# Patient Record
Sex: Female | Born: 1970 | Race: Black or African American | Hispanic: No | Marital: Single | Smoking: Never smoker
Health system: Southern US, Community
[De-identification: ages and names within clinical notes are randomized; demographics above are authoritative.]

## PROBLEM LIST (undated history)

## (undated) ENCOUNTER — Ambulatory Visit (HOSPITAL_COMMUNITY): Source: Home / Self Care

## (undated) DIAGNOSIS — IMO0002 Reserved for concepts with insufficient information to code with codable children: Secondary | ICD-10-CM

## (undated) DIAGNOSIS — T7840XA Allergy, unspecified, initial encounter: Secondary | ICD-10-CM

## (undated) DIAGNOSIS — R42 Dizziness and giddiness: Secondary | ICD-10-CM

## (undated) DIAGNOSIS — N12 Tubulo-interstitial nephritis, not specified as acute or chronic: Secondary | ICD-10-CM

## (undated) HISTORY — PX: CYSTECTOMY: SUR359

## (undated) HISTORY — DX: Allergy, unspecified, initial encounter: T78.40XA

## (undated) HISTORY — PX: EXTERNAL EAR SURGERY: SHX627

## (undated) HISTORY — DX: Reserved for concepts with insufficient information to code with codable children: IMO0002

---

## 1998-10-23 ENCOUNTER — Emergency Department (HOSPITAL_COMMUNITY): Admission: EM | Admit: 1998-10-23 | Discharge: 1998-10-23 | Payer: Self-pay | Admitting: Emergency Medicine

## 2000-09-03 ENCOUNTER — Other Ambulatory Visit: Admission: RE | Admit: 2000-09-03 | Discharge: 2000-09-03 | Payer: Self-pay | Admitting: *Deleted

## 2000-09-24 ENCOUNTER — Encounter: Admission: RE | Admit: 2000-09-24 | Discharge: 2000-09-24 | Payer: Self-pay | Admitting: *Deleted

## 2000-09-24 ENCOUNTER — Encounter: Payer: Self-pay | Admitting: *Deleted

## 2002-12-30 ENCOUNTER — Emergency Department (HOSPITAL_COMMUNITY): Admission: EM | Admit: 2002-12-30 | Discharge: 2002-12-30 | Payer: Self-pay | Admitting: Emergency Medicine

## 2003-03-30 ENCOUNTER — Emergency Department (HOSPITAL_COMMUNITY): Admission: EM | Admit: 2003-03-30 | Discharge: 2003-03-30 | Payer: Self-pay | Admitting: Emergency Medicine

## 2006-11-27 ENCOUNTER — Emergency Department (HOSPITAL_COMMUNITY): Admission: EM | Admit: 2006-11-27 | Discharge: 2006-11-27 | Payer: Self-pay | Admitting: Emergency Medicine

## 2007-06-14 ENCOUNTER — Emergency Department (HOSPITAL_COMMUNITY): Admission: EM | Admit: 2007-06-14 | Discharge: 2007-06-14 | Payer: Self-pay | Admitting: Emergency Medicine

## 2007-09-19 ENCOUNTER — Emergency Department (HOSPITAL_COMMUNITY): Admission: EM | Admit: 2007-09-19 | Discharge: 2007-09-19 | Payer: Self-pay | Admitting: Emergency Medicine

## 2009-09-16 ENCOUNTER — Emergency Department (HOSPITAL_COMMUNITY): Admission: EM | Admit: 2009-09-16 | Discharge: 2009-09-16 | Payer: Self-pay | Admitting: Emergency Medicine

## 2010-02-02 HISTORY — PX: BUTTOCK MASS EXCISION: SHX1279

## 2010-02-22 ENCOUNTER — Encounter: Payer: Self-pay | Admitting: Family Medicine

## 2010-06-17 NOTE — Consult Note (Signed)
NAME:  Hannah [name], ANDON NO.:  [id number]   MEDICAL RECORD NO.:  [id number]          PATIENT TYPE:  EMS   LOCATION:  ED                           FACILITY:  [facility]   PHYSICIAN:  Hermelinda Medicus, M.D.   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  Her external ear canal looked today to be quite  unremarkable as does her concha and pinna.  She has had multiple  piercings of her ears and now on the right side, she has a 1 cm size  serous collection which is slightly tender.  She complains of no real  pain but it appears that she is developing a right inferior concha  abscess which I am sure in the past was either a congenital or a  sebaceous cyst, where she will be treated first with antibiotics and  then this may need to be drained at a later day.  Her external ear canal  on the right side is clear more medial and her tympanic membrane is also  within normal limits.  Her nose and throat are also clear.  She has a  bad reaction apparently causing a stomach upset with Keflex, but can  take Augmentin without a major problem.   VITAL SIGNS:  Blood pressure is 110/62, heart rate 73.   MEDICATIONS:  She takes no medications.   PLAN:  Our plan today is to treat with Augmentin 875 by mouth as well as  Diflucan 150 mg first day, fifth day, 10th day and she wishes no pain  medication and therefore we will see her again in the office for Dr.  Ezzard Standing or myself on Wednesday.           ______________________________  Hermelinda Medicus, M.D.     JC/MEDQ  D:  06/14/2007  T:  06/14/2007  Job:  [id number]   cc:   Donnetta Hutching, MD  [address] [place], [place] [address]   Kristine Garbe. Ezzard Standing, M.D.  Fax: [phone]

## 2010-06-29 ENCOUNTER — Emergency Department (HOSPITAL_COMMUNITY)
Admission: EM | Admit: 2010-06-29 | Discharge: 2010-06-29 | Disposition: A | Discharge status: Home / Self Care | Payer: Medicaid Other | Attending: Emergency Medicine | Admitting: Emergency Medicine

## 2010-06-29 DIAGNOSIS — H60399 Other infective otitis externa, unspecified ear: Secondary | ICD-10-CM | POA: Insufficient documentation

## 2010-06-29 DIAGNOSIS — L0201 Cutaneous abscess of face: Secondary | ICD-10-CM | POA: Insufficient documentation

## 2010-06-29 DIAGNOSIS — Z9889 Other specified postprocedural states: Secondary | ICD-10-CM | POA: Insufficient documentation

## 2010-06-29 DIAGNOSIS — L03211 Cellulitis of face: Secondary | ICD-10-CM | POA: Insufficient documentation

## 2010-07-07 ENCOUNTER — Other Ambulatory Visit: Payer: Self-pay | Admitting: Family Medicine

## 2010-07-07 DIAGNOSIS — Z1231 Encounter for screening mammogram for malignant neoplasm of breast: Secondary | ICD-10-CM

## 2010-07-21 ENCOUNTER — Ambulatory Visit
Admission: RE | Admit: 2010-07-21 | Discharge: 2010-07-21 | Disposition: A | Discharge status: Home / Self Care | Payer: Medicaid Other | Source: Ambulatory Visit | Attending: Family Medicine | Admitting: Family Medicine

## 2010-07-21 DIAGNOSIS — Z1231 Encounter for screening mammogram for malignant neoplasm of breast: Secondary | ICD-10-CM

## 2010-07-28 ENCOUNTER — Ambulatory Visit (HOSPITAL_BASED_OUTPATIENT_CLINIC_OR_DEPARTMENT_OTHER)
Admission: RE | Admit: 2010-07-28 | Discharge: 2010-07-28 | Disposition: A | Discharge status: Home / Self Care | Payer: Medicaid Other | Source: Ambulatory Visit | Attending: Surgery | Admitting: Surgery

## 2010-07-28 ENCOUNTER — Other Ambulatory Visit (INDEPENDENT_AMBULATORY_CARE_PROVIDER_SITE_OTHER): Payer: Self-pay | Admitting: Surgery

## 2010-07-28 DIAGNOSIS — L723 Sebaceous cyst: Secondary | ICD-10-CM | POA: Insufficient documentation

## 2010-07-28 HISTORY — PX: OTHER SURGICAL HISTORY: SHX169

## 2010-07-31 NOTE — Op Note (Signed)
  [name], [name]                 ACCOUNT NO.:  [id number]  MEDICAL RECORD NO.:  [id number]  LOCATION:                                 FACILITY:  PHYSICIAN:  Sandria Bales. Ezzard Standing, M.D.  DATE OF BIRTH:  01/23/1971  DATE OF PROCEDURE:  07/28/2010                               OPERATIVE REPORT  PREOPERATIVE DIAGNOSIS:  A 1-cm lesion of suprapubic area, left inner thigh, left buttocks.  POSTOPERATIVE DIAGNOSIS:  A 1-cm lesion suprapubic area, 1 cm lesion of left inner thigh, 1 cm lesion left buttocks.  PROCEDURE:  Excision of subcutaneous lesions at the suprapubic area, left inner thigh, left buttocks.  SURGEON:  Sandria Bales. Ezzard Standing, MD  ANESTHESIA:  30 mL of 1% Xylocaine with epinephrine.  COMPLICATIONS:  None.  DETAILS OF THE PROCEDURE:  Hannah [name] is a 40 year old African American female, patient of Dr. Windle Guard, who has 3 skin lesions, 1 in her suprapubic area, 1 in her left inner thigh, and 1 in the left buttocks that she wanted to excise.  She now comes for excision of these areas. The indications and risks of the procedure were explained to the patient.  OPERATIVE NOTE:  The patient was placed first in supine and then left lateral decubitus position, each lesion was painted with Betadine solution.  I infiltrated a local anesthetic using a total of 13 mL between the 3 areas.  In each area, I excised the skin and the lesions.   They looked like small sebaceous cyst.  Each was excised in its entirety.  I closed with 5-0 Vicryl suture, painted each wound with Dermabond.  The patient tolerated the procedure well.  She will keep them dry for 24 hours, see me back in 2-3 weeks for wound check and review of pathology.    Sandria Bales. Ezzard Standing, M.D., FACS   DHN/MEDQ  D:  07/28/2010  T:  07/29/2010  Job:  [id number]  cc:   Windle Guard, M.D.  Electronically Signed by Ovidio Kin M.D. on 07/31/2010 07:17:23 AM

## 2010-08-07 ENCOUNTER — Encounter (INDEPENDENT_AMBULATORY_CARE_PROVIDER_SITE_OTHER): Payer: Self-pay | Admitting: Surgery

## 2010-08-14 ENCOUNTER — Ambulatory Visit (INDEPENDENT_AMBULATORY_CARE_PROVIDER_SITE_OTHER): Payer: Self-pay | Admitting: Surgery

## 2010-08-14 ENCOUNTER — Encounter (INDEPENDENT_AMBULATORY_CARE_PROVIDER_SITE_OTHER): Payer: Self-pay | Admitting: Surgery

## 2010-08-14 VITALS — Temp 97.1°F | Wt 167.4 lb

## 2010-08-14 DIAGNOSIS — L723 Sebaceous cyst: Secondary | ICD-10-CM

## 2010-08-14 NOTE — Progress Notes (Signed)
[  Old chart unavailable.]  The patient is a 40 year old African American female who sees Dr. Windle Guard is her primary care doctor.  She had 3 skin lesions: One in her suprapubic area, one in her left inner thigh, and one in the left buttocks.  I excised these 3 lesions on 28 July 2010. The pathology showed these to be sebaceous cyst.  Physical exam: All 3 incisions are well-healed.  Assessment and plan: #1. Sebaceous cyst of suprapubic area, left inner thigh, and left buttocks - all removed. Path report given to patient. Return to office PRN.

## 2011-02-13 ENCOUNTER — Emergency Department (HOSPITAL_COMMUNITY)
Admission: EM | Admit: 2011-02-13 | Discharge: 2011-02-13 | Disposition: A | Discharge status: Home / Self Care | Payer: Self-pay | Attending: Emergency Medicine | Admitting: Emergency Medicine

## 2011-02-13 ENCOUNTER — Encounter (HOSPITAL_COMMUNITY): Payer: Self-pay

## 2011-02-13 DIAGNOSIS — R42 Dizziness and giddiness: Secondary | ICD-10-CM | POA: Insufficient documentation

## 2011-02-13 HISTORY — DX: Dizziness and giddiness: R42

## 2011-02-13 MED ORDER — MECLIZINE HCL 25 MG PO TABS
25.0000 mg | ORAL_TABLET | Freq: Once | ORAL | Status: AC
  Administered 2011-02-13: 25 mg via ORAL
  Filled 2011-02-13: qty 1

## 2011-02-13 MED ORDER — MECLIZINE HCL 50 MG PO TABS: 25.0000 mg | ORAL_TABLET | Freq: Three times a day (TID) | ORAL | Status: AC | PRN

## 2011-02-13 NOTE — ED Notes (Signed)
Pt. States she woke this AM with dizziness and itching of right ear. Pt. States she was diagnosed with vertigo in 2011 and does not have medication now for vertigo. Pt. Denies nausea.

## 2011-02-13 NOTE — ED Provider Notes (Signed)
History     CSN: [id number]  Arrival date & time 02/13/11  2130   First MD Initiated Contact with Patient 02/13/11 [phone]      Chief Complaint  Patient presents with  . Dizziness    (Consider location/radiation/quality/duration/timing/severity/associated sxs/prior treatment) The history is provided by the patient.  pt c/o dizziness onset this morning when turned towards alarm clock in bed. Gets transient room spinning sensation, lasts seconds, worse w head movement. No nv. Does note recent mild nasal congestion, blowing nose, using q tip in ear. No ear pain. No headache. No fever or chills. Denies hearing loss or tinnitus. No eye pain or change in vision. No numbness/weaknes. No change balance or coordination.  No problems w dexterity in hands. Drove self to ED. States hx same due to 'bpv'. No headache. No recent trauma or fall.   Past Medical History  Diagnosis Date  . Cyst     supra pubic  . Allergy   . Vertigo     Past Surgical History  Procedure Date  . External ear surgery   . Inner thigh 07/28/2010    left upper thigh  . Buttock mass excision 2012  . Cystectomy     History reviewed. No pertinent family history.  History  Substance Use Topics  . Smoking status: Never Smoker   . Smokeless tobacco: Never Used  . Alcohol Use: Yes    OB History    Grav Para Term Preterm Abortions TAB SAB Ect Mult Living                  Review of Systems  Constitutional: Negative for fever and chills.  HENT: Negative for sore throat, neck pain and neck stiffness.   Eyes: Negative for visual disturbance.  Respiratory: Negative for shortness of breath.   Cardiovascular: Negative for chest pain and palpitations.  Gastrointestinal: Negative for vomiting and abdominal pain.  Genitourinary: Negative for flank pain.  Musculoskeletal: Negative for back pain.  Skin: Negative for rash.  Neurological: Negative for weakness, numbness and headaches.  Hematological: Does not bruise/bleed  easily.  Psychiatric/Behavioral: Negative for confusion.    Allergies  Review of patient's allergies indicates no known allergies.  Home Medications   Current Outpatient Rx  Name Route Sig Dispense Refill  . VITAMIN D 2000 UNITS PO CAPS Oral Take 2,000 capsules by mouth 1 day or 1 dose.      Marland Kitchen FLAX SEED OIL PO Oral Take by mouth 1 day or 1 dose.        BP 131/76  Pulse 86  Temp(Src) 98.1 F (36.7 C) (Oral)  Resp 16  SpO2 100%  Physical Exam  Nursing note and vitals reviewed. Constitutional: She is oriented to person, place, and time. She appears well-developed and well-nourished. No distress.  HENT:  Head: Atraumatic.  Right Ear: External ear normal.  Left Ear: External ear normal.  Mouth/Throat: Oropharynx is clear and moist.  Eyes: Conjunctivae are normal. Pupils are equal, round, and reactive to light. No scleral icterus.  Neck: Normal range of motion. Neck supple. No tracheal deviation present.  Cardiovascular: Normal rate, regular rhythm, normal heart sounds and intact distal pulses.  Exam reveals no gallop and no friction rub.   No murmur heard. Pulmonary/Chest: Effort normal and breath sounds normal. No respiratory distress.  Abdominal: Soft. Normal appearance. She exhibits no distension. There is no tenderness.  Musculoskeletal: She exhibits no edema.  Neurological: She is alert and oriented to person, place, and time. No cranial  nerve deficit. Coordination normal.       No facial droop or cr n deficit noted. Motor intact bil. Steady gait. Coordination normal. Transient unidirectional nystagmus.   Skin: Skin is warm and dry. No rash noted.  Psychiatric: She has a normal mood and affect.    ED Course  Procedures (including critical care time)     MDM  Pt notes hx same symptoms. States is out of antivert.   Recent qtips in ear and mild uri symptoms.  No other neurologic symptoms or neuro findings on exam.   antivert po.           Suzi Roots,  MD 02/13/11 [phone]

## 2011-02-13 NOTE — ED Notes (Signed)
Dr. Steinl at bedside 

## 2011-11-22 ENCOUNTER — Emergency Department (HOSPITAL_COMMUNITY): Payer: Self-pay

## 2011-11-22 ENCOUNTER — Emergency Department (HOSPITAL_COMMUNITY)
Admission: EM | Admit: 2011-11-22 | Discharge: 2011-11-23 | Disposition: A | Discharge status: Home / Self Care | Payer: Self-pay | Attending: Emergency Medicine | Admitting: Emergency Medicine

## 2011-11-22 ENCOUNTER — Encounter (HOSPITAL_COMMUNITY): Payer: Self-pay | Admitting: *Deleted

## 2011-11-22 DIAGNOSIS — R079 Chest pain, unspecified: Secondary | ICD-10-CM | POA: Insufficient documentation

## 2011-11-22 DIAGNOSIS — R0789 Other chest pain: Secondary | ICD-10-CM | POA: Insufficient documentation

## 2011-11-22 LAB — URINALYSIS, ROUTINE W REFLEX MICROSCOPIC
Bilirubin Urine: NEGATIVE
Hgb urine dipstick: NEGATIVE
Ketones, ur: NEGATIVE mg/dL
Nitrite: NEGATIVE
pH: 7.5 (ref 5.0–8.0)

## 2011-11-22 LAB — PREGNANCY, URINE: Preg Test, Ur: NEGATIVE

## 2011-11-22 LAB — CBC
MCH: 29.1 pg (ref 26.0–34.0)
MCHC: 33.1 g/dL (ref 30.0–36.0)
Platelets: 261 10*3/uL (ref 150–400)

## 2011-11-22 NOTE — ED Notes (Signed)
Upper chest wall pain on and off all day; states is "tightness"; denies other complaints

## 2011-11-22 NOTE — ED Notes (Addendum)
Pt denies chest pain at present.  Been under great deal of stress recent death of father.  Episodes all day lasting "just seconds"

## 2011-11-23 LAB — BASIC METABOLIC PANEL
Calcium: 9.1 mg/dL (ref 8.4–10.5)
Creatinine, Ser: 0.66 mg/dL (ref 0.50–1.10)
GFR calc non Af Amer: >90 mL/min (ref >90)
Glucose, Bld: 81 mg/dL (ref 70–99)
Sodium: 137 mEq/L (ref 135–145)

## 2011-11-23 LAB — TROPONIN I: Troponin I: <0.3 ng/mL (ref <0.30)

## 2011-11-23 NOTE — ED Provider Notes (Signed)
Medical screening examination/treatment/procedure(s) were performed by non-physician practitioner and as supervising physician I was immediately available for consultation/collaboration.    Lyanne Co, MD 11/23/11 [phone]

## 2011-11-23 NOTE — ED Provider Notes (Signed)
History     CSN: [id number]  Arrival date & time 11/22/11  2254   First MD Initiated Contact with Patient 11/22/11 2352      Chief Complaint  Patient presents with  . Chest Pain    (Consider location/radiation/quality/duration/timing/severity/associated sxs/prior treatment) HPI History provided by pt.   Pt has had 5 episodes of non-exertional, non-radiating, squeezing sensation in left anterior chest since 12pm today.  The pain is mild and seems to be superficial, like in her breast tissue.  No associated fever, cough, SOB, diaphoresis, nausea or abdominal pain.  Denies breast mass and bleeding/discharge from nipple.  Most recent mammogram 1 year ago and nml.  H/o reflux but this pain feels different.  Denies trauma.  No PMH, no FH of early MI and does not smoke cigarettes.    Past Medical History  Diagnosis Date  . Cyst     supra pubic  . Allergy   . Vertigo     Past Surgical History  Procedure Date  . External ear surgery   . Inner thigh 07/28/2010    left upper thigh  . Buttock mass excision 2012  . Cystectomy     No family history on file.  History  Substance Use Topics  . Smoking status: Never Smoker   . Smokeless tobacco: Never Used  . Alcohol Use: Yes    OB History    Grav Para Term Preterm Abortions TAB SAB Ect Mult Living                  Review of Systems  All other systems reviewed and are negative.    Allergies  Keflex  Home Medications   Current Outpatient Rx  Name Route Sig Dispense Refill  . LORATADINE 10 MG PO TABS Oral Take 10 mg by mouth daily.      BP 119/81  Pulse 84  Temp 98.5 F (36.9 C) (Oral)  Resp 20  SpO2 100%  LMP 11/09/2011  Physical Exam  Nursing note and vitals reviewed. Constitutional: She is oriented to person, place, and time. She appears well-developed and well-nourished. No distress.  HENT:  Head: Normocephalic and atraumatic.  Eyes:       Normal appearance  Neck: Normal range of motion.    Cardiovascular: Normal rate, regular rhythm and intact distal pulses.   Pulmonary/Chest: Effort normal and breath sounds normal. No respiratory distress. She exhibits no tenderness.       No pleuritic pain reported.  No breast mass.  Nml nipple.   Abdominal: Soft. Bowel sounds are normal. She exhibits no distension. There is no tenderness. There is no guarding.  Musculoskeletal: Normal range of motion.       No peripheral edema or calf tenderness  Neurological: She is alert and oriented to person, place, and time.  Skin: Skin is warm and dry. No rash noted.  Psychiatric: She has a normal mood and affect. Her behavior is normal.    ED Course  Procedures (including critical care time)   Date: 11/23/2011  Rate: 85  Rhythm: normal sinus rhythm  QRS Axis: normal  Intervals: normal  ST/T Wave abnormalities: normal  Conduction Disutrbances:none  Narrative Interpretation:   Old EKG Reviewed: none available   Labs Reviewed  CBC - Abnormal; Notable for the following:    Hemoglobin 11.3 (*)     HCT 34.1 (*)     All other components within normal limits  URINALYSIS, ROUTINE W REFLEX MICROSCOPIC - Abnormal; Notable for the following:  APPearance HAZY (*)     Leukocytes, UA SMALL (*)     All other components within normal limits  URINE MICROSCOPIC-ADD ON - Abnormal; Notable for the following:    Squamous Epithelial / LPF FEW (*)     All other components within normal limits  BASIC METABOLIC PANEL  TROPONIN I  PREGNANCY, URINE   Dg Chest 2 View  11/23/2011  *RADIOLOGY REPORT*  Clinical Data: Chest pain, pressure.  CHEST - 2 VIEW  Comparison: None.  Findings: Lungs are clear. No pleural effusion or pneumothorax. The cardiomediastinal contours are within normal limits. The visualized bones and soft tissues are without significant appreciable abnormality.  IMPRESSION: No radiographic evidence of acute cardiopulmonary process.   Original Report Authenticated By: Waneta Martins, M.D.       1. Chest pain       MDM  Healthy [phone] F presents w/ atypical CP.  Feels like its located in breast tissue.  Low risk ACS.  Currently pain free and pain is not reproducible on exam.  Breast tissue and nipple normal.  EKG non-ischemic, troponin and CXR neg.  Results discussed w/ pt.  Recommended mammogram as well as PCP f/u for persistent sx.  Return precautions discussed.         Otilio Miu, [place] 11/23/11 [phone]

## 2012-03-19 ENCOUNTER — Other Ambulatory Visit: Payer: Self-pay

## 2012-11-03 ENCOUNTER — Encounter (HOSPITAL_COMMUNITY): Payer: Self-pay | Admitting: *Deleted

## 2012-11-03 ENCOUNTER — Emergency Department (HOSPITAL_COMMUNITY)
Admission: EM | Admit: 2012-11-03 | Discharge: 2012-11-03 | Disposition: A | Discharge status: Home / Self Care | Payer: Worker's Compensation | Attending: Emergency Medicine | Admitting: Emergency Medicine

## 2012-11-03 ENCOUNTER — Emergency Department (HOSPITAL_COMMUNITY): Payer: Worker's Compensation

## 2012-11-03 DIAGNOSIS — Z79899 Other long term (current) drug therapy: Secondary | ICD-10-CM | POA: Insufficient documentation

## 2012-11-03 DIAGNOSIS — S0003XA Contusion of scalp, initial encounter: Secondary | ICD-10-CM | POA: Insufficient documentation

## 2012-11-03 DIAGNOSIS — S0083XA Contusion of other part of head, initial encounter: Secondary | ICD-10-CM

## 2012-11-03 DIAGNOSIS — Y990 Civilian activity done for income or pay: Secondary | ICD-10-CM | POA: Insufficient documentation

## 2012-11-03 DIAGNOSIS — Y9210 Unspecified residential institution as the place of occurrence of the external cause: Secondary | ICD-10-CM | POA: Insufficient documentation

## 2012-11-03 DIAGNOSIS — Z9889 Other specified postprocedural states: Secondary | ICD-10-CM | POA: Insufficient documentation

## 2012-11-03 DIAGNOSIS — Z872 Personal history of diseases of the skin and subcutaneous tissue: Secondary | ICD-10-CM | POA: Insufficient documentation

## 2012-11-03 DIAGNOSIS — W2203XA Walked into furniture, initial encounter: Secondary | ICD-10-CM | POA: Insufficient documentation

## 2012-11-03 DIAGNOSIS — Y93F2 Activity, caregiving, lifting: Secondary | ICD-10-CM | POA: Insufficient documentation

## 2012-11-03 NOTE — ED Notes (Signed)
Pt was working at [employer] this pm; pt was stating that she was assisting a pt out of the wheelchair with a Hoyer lift when the wheelchair took off and hit the hoyer lift and the bar of the lift struck pt in the left eye; pt denies LOC, pt denies visual loss or visual changes

## 2012-11-03 NOTE — ED Provider Notes (Signed)
CSN: [id number]     Arrival date & time 11/03/12  2045 History  This chart was scribed for non-physician practitioner Johnnette Gourd, PA-C, working with Ward Givens, MD by Ronal Fear, ED scribe. This patient was seen in room WTR7/WTR7 and the patient's care was started at 9:17 PM.    No chief complaint on file.  The history is provided by the patient. No language interpreter was used.  HPI Comments: Hannah [name] is a 42 y.o. female who presents to the Emergency Department after working at [employer] at 730 this evening, she was hit by a machine that struck her in the face  while helping a patient. Area around her left eye is tender to touch. She has not tried any alleviating factors. Applied ice immediately. Pt denies eye pain, visual disturbances, blurred vision double vision or pain with eye movement. No LOC. Denies confusion, n/v. Pt is in no acute distress with no other complaints.    Past Medical History  Diagnosis Date  . Cyst     supra pubic  . Allergy   . Vertigo    Past Surgical History  Procedure Laterality Date  . External ear surgery    . Inner thigh  07/28/2010    left upper thigh  . Buttock mass excision  2012  . Cystectomy     No family history on file. History  Substance Use Topics  . Smoking status: Never Smoker   . Smokeless tobacco: Never Used  . Alcohol Use: Yes   OB History   Grav Para Term Preterm Abortions TAB SAB Ect Mult Living                 Review of Systems  Eyes: Negative for pain, redness and visual disturbance.  Gastrointestinal: Negative for nausea and vomiting.  Psychiatric/Behavioral: Negative for confusion.  All other systems reviewed and are negative.    Allergies  Keflex  Home Medications   Current Outpatient Rx  Name  Route  Sig  Dispense  Refill  . Multiple Vitamins-Minerals (MULTIVITAMIN WITH MINERALS) tablet   Oral   Take 1 tablet by mouth daily.          There were no vitals taken for this visit. Physical Exam   Nursing note and vitals reviewed. Constitutional: She is oriented to person, place, and time. She appears well-developed and well-nourished. No distress.  HENT:  Head: Normocephalic and atraumatic.  Mouth/Throat: Oropharynx is clear and moist.  tenderness around left orbit, no step off noted, however limited by pain. Bruising to lower rim of orbit.   Eyes: Conjunctivae and EOM are normal. Pupils are equal, round, and reactive to light.  Neck: Normal range of motion. Neck supple.  Cardiovascular: Normal rate, regular rhythm and normal heart sounds.   Pulmonary/Chest: Effort normal and breath sounds normal. No respiratory distress.  Musculoskeletal: Normal range of motion. She exhibits no edema.  Neurological: She is alert and oriented to person, place, and time. No cranial nerve deficit or sensory deficit.  CN II-XII grossly intact.  Skin: Skin is warm and dry.  Psychiatric: She has a normal mood and affect. Her behavior is normal.    ED Course  Procedures (including critical care time)  DIAGNOSTIC STUDIES: Oxygen Saturation is 99% on RA, normal by my interpretation.    COORDINATION OF CARE: 9:24 PM- Pt advised of plan for treatment including X-ray for fractures and pt agrees.      Labs Review Labs Reviewed - No data  to display Imaging Review Ct Maxillofacial Wo Cm  11/03/2012   *RADIOLOGY REPORT*  Clinical Data: Left facial trauma.  CT MAXILLOFACIAL WITHOUT CONTRAST  Technique:  Multidetector CT imaging of the maxillofacial structures was performed. Multiplanar CT image reconstructions were also generated.  Comparison: None available at time of study interpretation.  Findings: Mandible appears intact, condyles located with sub centimeter sclerotic lesion right mandibular condyle most consistent with bone island.  No facial fracture.  Trace right maxillary mucosal thickening without air paranasal sinus air-fluid levels.  Mastoid air cells are well aerated.  Nasal septum is midline.   Ocular globes intact.  Mild left periorbital soft tissue swelling without post septal involvement. Partially imaged cervical spine demonstrates at least moderate C5-6 degenerative disc disease.  IMPRESSION: Mild left periorbital soft tissue swelling without post septal hematoma and no facial fracture.   Original Report Authenticated By: Awilda Metro    MDM   1. Facial contusion, initial encounter    Patient with facial contusion near L eye. No eye involvement. EOMI, PERRLA, no pain with eye movements. Tender to palpation around her orbit, exam limited by pain. CT maxillofacial without ay acute abnormalities. Neuro exam unremarkable. Well appearing, NAD. Stable for discharge. Close return precautions given. Ice/NSAIDs for symptoms. Patient states understanding of treatment care plan and is agreeable.   I personally performed the services described in this documentation, which was scribed in my presence. The recorded information has been reviewed and is accurate.    Trevor Mace, PA-C 11/03/12 2256

## 2012-11-03 NOTE — ED Provider Notes (Signed)
Medical screening examination/treatment/procedure(s) were performed by non-physician practitioner and as supervising physician I was immediately available for consultation/collaboration. Alanea Woolridge, MD, FACEP   Yarissa Reining L Lanore Renderos, MD 11/03/12 2340 

## 2012-12-08 ENCOUNTER — Other Ambulatory Visit: Payer: Self-pay

## 2013-01-23 ENCOUNTER — Emergency Department (HOSPITAL_COMMUNITY)
Admission: EM | Admit: 2013-01-23 | Discharge: 2013-01-23 | Disposition: A | Discharge status: Home / Self Care | Payer: PRIVATE HEALTH INSURANCE | Attending: Emergency Medicine | Admitting: Emergency Medicine

## 2013-01-23 DIAGNOSIS — L309 Dermatitis, unspecified: Secondary | ICD-10-CM

## 2013-01-23 DIAGNOSIS — L259 Unspecified contact dermatitis, unspecified cause: Secondary | ICD-10-CM | POA: Insufficient documentation

## 2013-01-23 MED ORDER — PREDNISONE 10 MG PO TABS: 20.0000 mg | ORAL_TABLET | Freq: Every day | ORAL | Status: DC

## 2013-01-23 NOTE — ED Provider Notes (Signed)
CSN: [id number]     Arrival date & time 01/23/13  2126 History  This chart was scribed for non-physician practitioner, Marlon Pel, PA-C,working with Raeford Razor, MD, by Karle Plumber, ED Scribe.  This patient was seen in room WTR9/WTR9 and the patient's care was started at 10:32 PM.  Chief Complaint  Patient presents with  . Rash   The history is provided by the patient. No language interpreter was used.   HPI Comments:  Hannah [name] is a 42 y.o. female who presents to the Emergency Department complaining of a rash on her lower extremities. Pt states the rash started on her upper right thigh about 5 days ago and has been itchy in that area. She states now the rash has has spread down to her lower leg, but that area is not itching. She denies any new creams, soaps, or lotions. She denies any rash on her abdomen, upper extremities, feet, torso, back or anywhere else. She denies fever, chills, appetite change or pain. She states her PCP is Dr. Jeannetta Nap and has follow up appt with him on Friday 01/27/13.   Past Medical History  Diagnosis Date  . Cyst     supra pubic  . Allergy   . Vertigo    Past Surgical History  Procedure Laterality Date  . External ear surgery    . Inner thigh  07/28/2010    left upper thigh  . Buttock mass excision  2012  . Cystectomy     No family history on file. History  Substance Use Topics  . Smoking status: Never Smoker   . Smokeless tobacco: Never Used  . Alcohol Use: Yes   OB History   Grav Para Term Preterm Abortions TAB SAB Ect Mult Living                 Review of Systems  Constitutional: Negative for fever, chills and appetite change.  Skin: Positive for rash.  All other systems reviewed and are negative.    Allergies  Keflex  Home Medications   Current Outpatient Rx  Name  Route  Sig  Dispense  Refill  . Multiple Vitamins-Minerals (MULTIVITAMIN WITH MINERALS) tablet   Oral   Take 1 tablet by mouth daily.         .  predniSONE (DELTASONE) 10 MG tablet   Oral   Take 2 tablets (20 mg total) by mouth daily.   21 tablet   0     Prednisone dose pack directions:   6 tabs on day ...    Triage Vitals: BP 117/70  Pulse 81  Temp(Src) 98.6 F (37 C) (Oral)  Resp 16  SpO2 100% Physical Exam  Nursing note and vitals reviewed. Constitutional: She is oriented to person, place, and time. She appears well-developed and well-nourished.  HENT:  Head: Normocephalic and atraumatic.  Eyes: EOM are normal.  Neck: Normal range of motion.  Cardiovascular: Normal rate.   Pulmonary/Chest: Effort normal.  Musculoskeletal: Normal range of motion.  Neurological: She is alert and oriented to person, place, and time.  Skin: Skin is warm and dry. Rash noted.  Contact dermatitis to bilateral lower extremities. Rash spares the ankles and feet, and do not extend to the abdomen, torso, arms, neck or head.  The rash is itchy and with mild excoriation.  Psychiatric: She has a normal mood and affect. Her behavior is normal.    ED Course  Procedures (including critical care time) DIAGNOSTIC STUDIES: Oxygen Saturation is 100% on  RA, normal by my interpretation.   COORDINATION OF CARE: 10:38 PM- Will treat for an allergic reaction with a Prednisone dose-pak. Advised pt to get OTC Benadryl. Pt verbalizes understanding and agrees to plan.  Medications - No data to display  Labs Review Labs Reviewed - No data to display Imaging Review No results found.  EKG Interpretation   None       MDM   1. Dermatitis    42 y.o.Donnika A Gilroy's evaluation in the Emergency Department is complete. It has been determined that no acute conditions requiring further emergency intervention are present at this time. The patient/guardian have been advised of the diagnosis and plan. We have discussed signs and symptoms that warrant return to the ED, such as changes or worsening in symptoms.  Vital signs are stable at discharge. Filed  Vitals:   01/23/13 2146  BP: 117/70  Pulse: 81  Temp: 98.6 F (37 C)  Resp: 16    Patient/guardian has voiced understanding and agreed to follow-up with the PCP or specialist.  I personally performed the services described in this documentation, which was scribed in my presence. The recorded information has been reviewed and is accurate.    Dorthula Matas, PA-C 01/23/13 2249

## 2013-01-23 NOTE — ED Notes (Signed)
Pt c/o fine red petechial rash to bilateral legs. Pt states rash started last week on her upper thigh and has since spread. Pt states rash is not itchy. Pt with no acute distress.

## 2013-01-31 NOTE — ED Provider Notes (Signed)
Medical screening examination/treatment/procedure(s) were performed by non-physician practitioner and as supervising physician I was immediately available for consultation/collaboration.  EKG Interpretation   None        Raeford Razor, MD 01/31/13 2159

## 2013-02-23 ENCOUNTER — Encounter (INDEPENDENT_AMBULATORY_CARE_PROVIDER_SITE_OTHER): Payer: Self-pay | Admitting: Surgery

## 2013-04-07 ENCOUNTER — Encounter (INDEPENDENT_AMBULATORY_CARE_PROVIDER_SITE_OTHER): Payer: Self-pay | Admitting: Surgery

## 2013-04-27 ENCOUNTER — Encounter (INDEPENDENT_AMBULATORY_CARE_PROVIDER_SITE_OTHER): Payer: Self-pay | Admitting: Surgery

## 2013-04-27 ENCOUNTER — Telehealth (INDEPENDENT_AMBULATORY_CARE_PROVIDER_SITE_OTHER): Payer: Self-pay | Admitting: Surgery

## 2013-04-27 ENCOUNTER — Ambulatory Visit (INDEPENDENT_AMBULATORY_CARE_PROVIDER_SITE_OTHER): Payer: PRIVATE HEALTH INSURANCE | Admitting: Surgery

## 2013-04-27 VITALS — BP 108/80 | HR 82 | Resp 16 | Ht 66.0 in | Wt 164.6 lb

## 2013-04-27 DIAGNOSIS — L723 Sebaceous cyst: Secondary | ICD-10-CM | POA: Insufficient documentation

## 2013-04-27 NOTE — Telephone Encounter (Signed)
Patient met with surgery scheduling went over financial responsibilities, patient will call back to schedule

## 2013-04-27 NOTE — Progress Notes (Signed)
Re:   Hannah BurnMary A [name] DOB:   October 10, 1970 MRN:   [id number]  ASSESSMENT AND PLAN: 1.  3 skin lesions  1.0 cm lesion between the breast  0.5 cm lesion right anterior labia  0.5 cm lesion left posterior labia/vagina  Discussed about excising these under local anesthesia.  It would take about one hour to remove them all.  The main risks would be bleeding, infection, recurrence of the cysts, and nerve injury.  Chief Complaint  Patient presents with  . chest wall cyst   REFERRING PHYSICIAN: Kaleen MaskELKINS,WILSON OLIVER, MD  HISTORY OF PRESENT ILLNESS: Hannah BurnMary A Eleazer is a 43 y.o. (DOB: October 10, 1970)  AA  female whose primary care physician is Kaleen MaskELKINS,WILSON OLIVER, MD and comes to me today for several skin lesions that are bothering her.  She has noticed these for some time.  The one between her breast has gotten larger and the two lesions around her vagina bother her.  We reviewed each of these.   Past Medical History  Diagnosis Date  . Cyst     supra pubic  . Allergy   . Vertigo       Past Surgical History  Procedure Laterality Date  . External ear surgery    . Inner thigh  07/28/2010    left upper thigh  . Buttock mass excision  2012  . Cystectomy        Current Outpatient Prescriptions  Medication Sig Dispense Refill  . Multiple Vitamins-Minerals (MULTIVITAMIN WITH MINERALS) tablet Take 1 tablet by mouth daily.      . predniSONE (DELTASONE) 10 MG tablet Take 2 tablets (20 mg total) by mouth daily.  21 tablet  0   No current facility-administered medications for this visit.      Allergies  Allergen Reactions  . Keflex [Cephalexin] Itching    REVIEW OF SYSTEMS: Skin:  Concerned about several areas on skin: back, right anterior thigh, and between breast. Infection:  No history of hepatitis or HIV.  No history of MRSA. Neurologic:  No history of stroke.  No history of seizure.  No history of headaches. Cardiac:  No history of hypertension. No history of heart disease.   . Pulmonary:  Does not smoke cigarettes.    Endocrine:  No diabetes. No thyroid disease. Gastrointestinal:  No history of stomach disease.  No history of liver disease.  No history of gall bladder disease.  No history of pancreas disease.  No history of colon disease. Urologic:  No history of kidney stones.  No history of bladder infections. Musculoskeletal:  No history of joint or back disease. Hematologic:  No bleeding disorder.  No history of anemia.  Not anticoagulated. Psycho-social:  The patient is oriented.     SOCIAL and FAMILY HISTORY: Works as LawyerCNA at [employer].  She said that she has been there about 6 months.  PHYSICAL EXAM: BP 108/80  Pulse 82  Resp 16  Ht 5\' 6"  (1.676 m)  Wt 164 lb 9.6 oz (74.662 kg)  BMI 26.58 kg/m2  General: WN AA F who is alert and generally healthy appearing.  HEENT: Normal. Pupils equal. Neck: Supple. No mass.   Breasts:  Right and left breast unremarkable.  Between her breast she has a 1.0 cm sebaceous cyst.  She has some acne also that is not amendable to removal. Back:  Acne of upper back, which she says is getting better Vagina/perineum:  0.5 cm cyst right anterior labia and 0.5 cm cyst left posterior labia/perineum.  DATA REVIEWED: None new  Ovidio Kin, MD,  [facility], [place] [address] [place]  [address]   [place], [place]    [address] Phone:  [phone] FAX:  [phone]

## 2013-08-28 ENCOUNTER — Encounter (HOSPITAL_COMMUNITY): Payer: Self-pay | Admitting: Emergency Medicine

## 2013-08-28 ENCOUNTER — Emergency Department (INDEPENDENT_AMBULATORY_CARE_PROVIDER_SITE_OTHER)
Admission: EM | Admit: 2013-08-28 | Discharge: 2013-08-28 | Disposition: A | Discharge status: Home / Self Care | Payer: PRIVATE HEALTH INSURANCE | Source: Home / Self Care | Attending: Emergency Medicine | Admitting: Emergency Medicine

## 2013-08-28 DIAGNOSIS — W57XXXA Bitten or stung by nonvenomous insect and other nonvenomous arthropods, initial encounter: Principal | ICD-10-CM

## 2013-08-28 DIAGNOSIS — S70262A Insect bite (nonvenomous), left hip, initial encounter: Secondary | ICD-10-CM

## 2013-08-28 DIAGNOSIS — S90569A Insect bite (nonvenomous), unspecified ankle, initial encounter: Secondary | ICD-10-CM

## 2013-08-28 NOTE — Discharge Instructions (Signed)
Please use topical benadryl cream or oral benadryl as advised on packing for itching. Continue local wound care at home. You to do have indication of secondary infection.  Fire [employer]nt Bite A fire ant bite appears as a red lump in the skin. It sometimes has a tiny hole in the center. Reactions to these bites can be severe. A severe reaction is called an anaphylactic reaction. With a severe reaction there may be symptoms of wheezing or difficulty breathing, chest pain, fainting and raised red patches on the skin (hives) that itch. There may also be nausea (feeling sick to your stomach), vomiting, cramping or diarrhea. Usually after 24 hours a small sterile pustule (a tiny sac in the skin filled with pus but no germs) develops. There may be itching, burning, and redness. HOME CARE INSTRUCTIONS   Apply a cold compress for 10 to 20 minutes every hour for 1 to 2 days. This will reduce swelling and itching.  After 24 to 48 hours, a warm compress may be soothing and will help decrease swelling.  To relieve itching and swelling, you may use:  Diphenhydramine, available over-the-counter. Take medicine as directed. Do not drink alcohol or drive while taking this medicine.  Hydrocortisone cream may be applied lightly 4 times per day for a couple days or as directed.  Calamine lotion with diphenhydramine may be used lightly on the bite 4 times per day for itching or as directed. Do not take with oral diphenhydramine.  Only take over-the-counter or prescription medicines for pain, discomfort, or fever as directed by your caregiver. SEEK MEDICAL CARE IF:   None of the above helps within 2 to 3 days.  The area becomes red, warm, tender, and swollen beyond the area of the bite or sting. SEEK IMMEDIATE MEDICAL CARE IF:   You have a fever.  You have symptoms of an allergic reaction (wheezing or difficulty breathing).  You develop chest pain, fainting, or raised red patches on the skin that itch.  You  develop nausea, vomiting, cramping, or diarrhea. These may be early signs of a serious generalized or anaphylactic reaction. MAKE SURE YOU:   Understand these instructions.  Will watch your condition.  Will get help right away if you are not doing well or get worse. Document Released: 10/14/2000 Document Revised: 04/13/2011 Document Reviewed: 01/11/2008 [facility] Patient Information 2015 [employer] [place]. This information is not intended to replace advice given to you by your health care provider. Make sure you discuss any questions you have with your health care provider.

## 2013-08-28 NOTE — ED Provider Notes (Signed)
CSN: [id number]     Arrival date & time 08/28/13  1043 History   First MD Initiated Contact with Patient 08/28/13 1110     Chief Complaint  Patient presents with  . Insect Bite   (Consider location/radiation/quality/duration/timing/severity/associated sxs/prior Treatment) HPI Comments: States she was stung by an unknown insect at her left posterior thigh 2 days ago and area is now very itchy. Denies pain, fever or chills. Has tried topical hydrocortisone cream and topical neosporin. Both of these medications have brought her some relief. States she come to the clinic today just to make sure that are does not appear to be infected.  PCP: Dr. Hart CarwinW. Elkins  The history is provided by the patient.    Past Medical History  Diagnosis Date  . Cyst     supra pubic  . Allergy   . Vertigo    Past Surgical History  Procedure Laterality Date  . External ear surgery    . Inner thigh  07/28/2010    left upper thigh  . Buttock mass excision  2012  . Cystectomy     History reviewed. No pertinent family history. History  Substance Use Topics  . Smoking status: Never Smoker   . Smokeless tobacco: Never Used  . Alcohol Use: Yes   OB History   Grav Para Term Preterm Abortions TAB SAB Ect Mult Living                 Review of Systems  All other systems reviewed and are negative.   Allergies  Keflex  Home Medications   Prior to Admission medications   Medication Sig Start Date End Date Taking? Authorizing Provider  Multiple Vitamins-Minerals (MULTIVITAMIN WITH MINERALS) tablet Take 1 tablet by mouth daily.    Historical Provider, MD  predniSONE (DELTASONE) 10 MG tablet Take 2 tablets (20 mg total) by mouth daily. 01/23/13   Tiffany Irine SealG Greene, PA-C   BP 104/59  Pulse 88  Temp(Src) 98.9 F (37.2 C) (Oral)  Resp 16  SpO2 98% Physical Exam  Nursing note and vitals reviewed. Constitutional: She is oriented to person, place, and time. She appears well-developed and well-nourished.    HENT:  Head: Normocephalic and atraumatic.  Cardiovascular: Normal rate.   Pulmonary/Chest: Effort normal.  Musculoskeletal: Normal range of motion.  Neurological: She is alert and oriented to person, place, and time.  Skin: Skin is warm and dry.     Psychiatric: She has a normal mood and affect. Her behavior is normal.    ED Course  Procedures (including critical care time) Labs Review Labs Reviewed - No data to display  Imaging Review No results found.   MDM   1. Insect bite of hip with local reaction, left, initial encounter   No clinical indication of secondary infection. While area does have 3 small fluid filled vesicles, does not appear to be herpes zoster as it completely without pain. Localized reaction to likely insect bite. Advised to use either topical or oral benadryl at home with continued local wound care.     Jess BartersJennifer Lee Hem[place] BridgePresson, [place] 08/28/13 1258

## 2013-08-28 NOTE — ED Notes (Signed)
States she felt bite type sensation on leg 2 days ago, now feels swollen, itchy

## 2013-08-28 NOTE — ED Provider Notes (Signed)
Medical screening examination/treatment/procedure(s) were performed by resident physician or non-physician practitioner and as supervising physician I was immediately available for consultation/collaboration.  Randal BubaErin Waylan Busta, MD     Charm RingsErin J Bevely Hackbart, MD 08/28/13 1420

## 2014-08-13 ENCOUNTER — Other Ambulatory Visit: Payer: Self-pay | Admitting: Obstetrics and Gynecology

## 2014-08-13 DIAGNOSIS — R928 Other abnormal and inconclusive findings on diagnostic imaging of breast: Secondary | ICD-10-CM

## 2014-08-20 ENCOUNTER — Other Ambulatory Visit: Payer: Self-pay

## 2014-12-10 ENCOUNTER — Ambulatory Visit
Admission: RE | Admit: 2014-12-10 | Discharge: 2014-12-10 | Disposition: A | Discharge status: Home / Self Care | Payer: PRIVATE HEALTH INSURANCE | Source: Ambulatory Visit | Attending: Obstetrics and Gynecology | Admitting: Obstetrics and Gynecology

## 2014-12-10 DIAGNOSIS — R928 Other abnormal and inconclusive findings on diagnostic imaging of breast: Secondary | ICD-10-CM

## 2016-07-08 ENCOUNTER — Ambulatory Visit (HOSPITAL_COMMUNITY)
Admission: EM | Admit: 2016-07-08 | Discharge: 2016-07-08 | Disposition: A | Discharge status: Home / Self Care | Payer: PRIVATE HEALTH INSURANCE | Attending: Internal Medicine | Admitting: Internal Medicine

## 2016-07-08 ENCOUNTER — Encounter (HOSPITAL_COMMUNITY): Payer: Self-pay | Admitting: Emergency Medicine

## 2016-07-08 DIAGNOSIS — R300 Dysuria: Secondary | ICD-10-CM | POA: Insufficient documentation

## 2016-07-08 DIAGNOSIS — N390 Urinary tract infection, site not specified: Secondary | ICD-10-CM | POA: Diagnosis present

## 2016-07-08 HISTORY — DX: Tubulo-interstitial nephritis, not specified as acute or chronic: N12

## 2016-07-08 LAB — POCT URINALYSIS DIP (DEVICE)
BILIRUBIN URINE: NEGATIVE
Glucose, UA: NEGATIVE mg/dL
Ketones, ur: NEGATIVE mg/dL
LEUKOCYTES UA: NEGATIVE
Nitrite: NEGATIVE
PH: 7 (ref 5.0–8.0)
Protein, ur: NEGATIVE mg/dL
SPECIFIC GRAVITY, URINE: 1.015 (ref 1.005–1.030)
UROBILINOGEN UA: 0.2 mg/dL (ref 0.0–1.0)

## 2016-07-08 NOTE — Discharge Instructions (Signed)
Call if you are getting worse, have fever, back pain, or worsening dysuria.

## 2016-07-08 NOTE — ED Triage Notes (Signed)
2 days ago noticed pulsating feeling in back.  Pain at the end of urinary stream.

## 2016-07-08 NOTE — ED Provider Notes (Signed)
CSN: [id number]     Arrival date & time 07/08/16  1536 History   None    Chief Complaint  Patient presents with  . Urinary Tract Infection   (Consider location/radiation/quality/duration/timing/severity/associated sxs/prior Treatment)  Complains of pulsing in the kidney that started two days ago and has since resolved.  Had one episode of dysuria last night near the end of urination and tells me that the dysuria has since improved.  Has tried cranberry juice and copious fluids with good improvement. 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 She is not urinating more frequently and denies any outright urinary urgency. She is near the end of her menses at day 4. Denies any antipyretic at this time. No changes in bowel habits at this time. Has a history of pyelonephritis over 20 years ago but no UTI since.  Denies abnormal vaginal discharge.        Past Medical History:  Diagnosis Date  . Allergy   . Cyst    supra pubic  . Pyelonephritis   . Vertigo    Past Surgical History:  Procedure Laterality Date  . BUTTOCK MASS EXCISION  2012  . CYSTECTOMY    . EXTERNAL EAR SURGERY    . inner thigh  07/28/2010   left upper thigh   History reviewed. No pertinent family history. Social History  Substance Use Topics  . Smoking status: Never Smoker  . Smokeless tobacco: Never Used  . Alcohol use Yes   OB History    No data available     Review of Systems  Genitourinary: Negative for dysuria, flank pain, menstrual problem, pelvic pain and urgency.    Allergies  Keflex [cephalexin]  Home Medications   Prior to Admission medications   Medication Sig Start Date End Date Taking? Authorizing Provider  cetirizine (ZYRTEC) 10 MG tablet Take 10 mg by mouth daily.   Yes [provider]  omega-3 acid ethyl esters (LOVAZA) 1 g capsule Take by mouth 2 (two) times daily.   Yes [provider]  Multiple Vitamins-Minerals (MULTIVITAMIN WITH MINERALS) tablet Take 1 tablet by mouth daily.     [provider]  predniSONE (DELTASONE) 10 MG tablet Take 2 tablets (20 mg total) by mouth daily. 01/23/13   Marlon PelGreene, Tiffany, PA-C   Meds Ordered and Administered this Visit  Medications - No data to display  BP 135/83 (BP Location: Right Arm)   Pulse 83   Temp 98.6 F (37 C) (Oral)   Resp 18   SpO2 99%  No data found.   Physical Exam  Constitutional: She appears well-developed and well-nourished. No distress.  Cardiovascular: Normal rate.   Pulmonary/Chest: Effort normal and breath sounds normal. No respiratory distress.  Abdominal: Soft. Bowel sounds are normal.  Musculoskeletal: She exhibits no edema, tenderness or deformity.  Skin: She is not diaphoretic.    Urgent Care Course     Procedures (including critical care time)  Labs Review Labs Reviewed  POCT URINALYSIS DIP (DEVICE) - Abnormal; Notable for the following:       Result Value   Hgb urine dipstick MODERATE (*)    All other components within normal limits  URINE CULTURE         MDM   1. Dysuria    She is on her cycle. Largely asymptomatic today and she has a history of what sound like a severe pyelonephritis in her 4920s, thus provoking some hypervigilence with regard to any urinary symptoms.   If she begins to have fevers,  chills, flank pain, worsening dysuria will order Cipro 500 mg BID and advise that she return to clinic ASAP. Otherwise I think she is well and do not recommend any treatments at this time.     Ofilia Neas, PA-C 07/08/16 1648    Ofilia Neas, PA-C 07/08/16 1726

## 2016-07-10 LAB — URINE CULTURE: Culture: >=100000 — AB

## 2016-08-25 ENCOUNTER — Encounter (HOSPITAL_COMMUNITY): Payer: Self-pay | Admitting: Emergency Medicine

## 2016-08-25 ENCOUNTER — Emergency Department (HOSPITAL_COMMUNITY)
Admission: EM | Admit: 2016-08-25 | Discharge: 2016-08-25 | Disposition: A | Discharge status: Home / Self Care | Payer: PRIVATE HEALTH INSURANCE | Attending: Emergency Medicine | Admitting: Emergency Medicine

## 2016-08-25 ENCOUNTER — Emergency Department (HOSPITAL_COMMUNITY): Payer: PRIVATE HEALTH INSURANCE

## 2016-08-25 ENCOUNTER — Emergency Department (HOSPITAL_COMMUNITY)
Admission: EM | Admit: 2016-08-25 | Discharge: 2016-08-25 | Discharge status: Absent without leave | Payer: PRIVATE HEALTH INSURANCE | Source: Home / Self Care

## 2016-08-25 DIAGNOSIS — Y92128 Other place in nursing home as the place of occurrence of the external cause: Secondary | ICD-10-CM | POA: Insufficient documentation

## 2016-08-25 DIAGNOSIS — S6991XA Unspecified injury of right wrist, hand and finger(s), initial encounter: Secondary | ICD-10-CM | POA: Diagnosis present

## 2016-08-25 DIAGNOSIS — Y990 Civilian activity done for income or pay: Secondary | ICD-10-CM | POA: Insufficient documentation

## 2016-08-25 DIAGNOSIS — Z79899 Other long term (current) drug therapy: Secondary | ICD-10-CM | POA: Insufficient documentation

## 2016-08-25 DIAGNOSIS — Y9389 Activity, other specified: Secondary | ICD-10-CM | POA: Diagnosis not present

## 2016-08-25 DIAGNOSIS — S62524A Nondisplaced fracture of distal phalanx of right thumb, initial encounter for closed fracture: Secondary | ICD-10-CM

## 2016-08-25 DIAGNOSIS — W230XXA Caught, crushed, jammed, or pinched between moving objects, initial encounter: Secondary | ICD-10-CM | POA: Insufficient documentation

## 2016-08-25 NOTE — Discharge Instructions (Signed)
Your xrays show probable thumb fracture. As we discussed you can take Alieve at home for pain.   How is this treated? A thumb fracture should be treated as soon as possible. You may need to wear a padded splint to keep your thumb from moving and to protect your thumb  Immobilization. A cast or splint is put on the injured area without changing the position of the broken bone. You may have to wear a type of cast called a spica cast or hitchhiker cast to hold the thumb in the proper position. A cast is usually left on for 4?6 weeks. You willl need to follow up with hand (ortho) in 1 week to decide if further treatment is needed. This can include (but is unlikely you will need this)  Closed reduction. In this procedure, the bones are put back into position without surgery. Open reduction and internal fixation (ORIF). This is a surgical procedure. First, the fracture site is opened up. Then, the bone pieces are fixed into place with metal screws, plates, or wires. External fixation. In this type of open reduction, the fracture is held in place by metal pins. The pins are attached to a stabilizing bar outside your skin. You may need to wear a cast after surgery for up to 6 weeks. You may need to return for X-rays to make sure your thumb is healing properly. After your cast is taken off, you may need to do hand exercises (physical therapy) to get movement back in your thumb. It may take another 3 months to regain complete use of your thumb.  Follow these instructions at home: Take medicines only as directed by your health care provider. Keep your hand elevated above the level of your heart when resting. Keep your cast dry when bathing. Cover it with a plastic bag as directed by your health care provider. After your cast is removed, exercise your thumb at home. Your health care provider may suggest that you: Move your thumb in circles. Touch your thumb to your little finger. Do these exercises  several times a day. Ask your health care provider whether you can use a hand exerciser to strengthen your muscles. If your thumb feels stiff while you are exercising it, try doing the exercises while soaking your hand in warm water. Keep all follow-up visits as directed by your health care provider. This is important. Contact a health care provider if: You have more than a small spot of bleeding from under your cast or splint. Your pain medicine is not helping. You have a fever. You have numbness or tingling in the injured area. Your cast becomes loose or damaged. You notice a bad odor or discharge coming from under your cast. Get help right away if: You have pain that is very bad or getting worse. You lose feeling in your thumb. Your thumb turns pale or blue. Your thumb feels cold. You have drainage, redness, or swelling at the injury site.  If you develop worsening or new concerning symptoms you can return to the emergency department for re-evaluation.

## 2016-08-25 NOTE — ED Notes (Signed)
Patient was offered an ice pack but she does not want one.

## 2016-08-25 NOTE — ED Provider Notes (Signed)
[facility]-EMERGENCY DEPT Provider Note   CSN: [id number] Arrival date & time: 08/25/16  1404     History   Chief Complaint Chief Complaint  Patient presents with  . Finger Injury    HPI Hannah [name] is a 46 y.o. female female who presents to the emergency department today after smashing her right thumb in a heavy door around 8:30 AM this morning. 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 During her shift she was trying to close a door to the patient's room on the door slammed into the distal aspect of her right thumb. She immediately noted pain over the distal portion of the thumb that is now throbbing in nature and has been constant since then. She denies any numbness or tingling associated with this. No decreased range of motion or weakness. She has not taken anything for this.  HPI  Past Medical History:  Diagnosis Date  . Allergy   . Cyst    supra pubic  . Pyelonephritis   . Vertigo     Patient Active Problem List   Diagnosis Date Noted  . Sebaceous cyst 04/27/2013    Past Surgical History:  Procedure Laterality Date  . BUTTOCK MASS EXCISION  2012  . CYSTECTOMY    . EXTERNAL EAR SURGERY    . inner thigh  07/28/2010   left upper thigh    OB History    No data available       Home Medications    Prior to Admission medications   Medication Sig Start Date End Date Taking? Authorizing Provider  cetirizine (ZYRTEC) 10 MG tablet Take 10 mg by mouth daily.    [provider]  Multiple Vitamins-Minerals (MULTIVITAMIN WITH MINERALS) tablet Take 1 tablet by mouth daily.    [provider]  omega-3 acid ethyl esters (LOVAZA) 1 g capsule Take by mouth 2 (two) times daily.    [provider]    Family History No family history on file.  Social History Social History  Substance Use Topics  . Smoking status: Never Smoker  . Smokeless tobacco: Never Used  . Alcohol use Yes     Allergies   Keflex [cephalexin]   Review of  Systems Review of Systems  Musculoskeletal: Positive for arthralgias. Negative for myalgias.  Skin: Negative for wound.  Neurological: Negative for weakness and numbness.     Physical Exam Updated Vital Signs BP 133/79 (BP Location: Right Arm)   Pulse 70   Temp 98.7 F (37.1 C) (Oral)   Resp 20   Ht 5\' 5"  (1.651 m)   Wt 84.3 kg (185 lb 12.8 oz)   LMP 08/25/2016   SpO2 100%   BMI 30.92 kg/m   Physical Exam  Constitutional: She appears well-developed and well-nourished.  HENT:  Head: Normocephalic and atraumatic.  Right Ear: External ear normal.  Left Ear: External ear normal.  Eyes: Conjunctivae are normal. Right eye exhibits no discharge. Left eye exhibits no discharge. No scleral icterus.  Pulmonary/Chest: Effort normal. No respiratory distress.  Musculoskeletal:  Right hand: No gross abnormalities, skin intact. Fingers appear normal. No tenderness to palpation over the flexor sheath. There is tenderness over the distal phalanx of the right thumb. Radial and ulnar arteries 2+ with 2 second cap refill of all fingers. Sensory is intact to light touch in the medial, ulnar, and radial distributions. Dynamic 2+ discrimination intact over the median, ulnar nerve distributions on all her and radial aspects of all digits. Grip strength 5/5. MCP  flexion and extension intact. Finger abduction in/abduction and intact with 5/5 strength. Thumb opposition intact. Full range of motion to flexion and extension at the wrist, MCP, PIP, DIP. FDS/FDP intact.   Neurological: She is alert.  Skin: Skin is warm and dry. No laceration noted. No pallor.  Psychiatric: She has a normal mood and affect.  Nursing note and vitals reviewed.    ED Treatments / Results  Labs (all labs ordered are listed, but only abnormal results are displayed) Labs Reviewed - No data to display  EKG  EKG Interpretation None       Radiology Dg Finger Thumb Right  Result Date: 08/25/2016 CLINICAL DATA:  Right  thumb injury today. EXAM: RIGHT THUMB 2+V COMPARISON:  None. FINDINGS: Small bone fragment is seen adjacent to proximal base of first distal phalanx concerning for fracture of indeterminate age. Also noted is possible nondisplaced fracture. There is no evidence of arthropathy. Soft tissues are unremarkable IMPRESSION: Small bone fragments seen adjacent to proximal base of first distal phalanx concerning for fracture of indeterminate age. Electronically Signed   By: Lupita Raider, M.D.   On: 08/25/2016 16:27    Procedures Procedures (including critical care time)  Medications Ordered in ED Medications - No data to display   Initial Impression / Assessment and Plan / ED Course  I have reviewed the triage vital signs and the nursing notes.  Pertinent labs & imaging results that were available during my care of the patient were reviewed by me and considered in my medical decision making (see chart for details).     46 year old female presenting today for right thumb pain after slamming thumb into door this evening. Her exam is unremarkable other than for tenderness over the distal phalanx. X-rays are suggestive of fracture. We will treat the patient with a splint. Advised the patient that she will need to stay in splint until she is seen by hand. I offered the patient pain medication in the emergency department but she declined. I advised the patient to return to the emergency department with new or worsening symptoms or new concerns. Specific return precautions discussed. The patient verbalized understanding and is in agreement with plan. All questions answered. No further questions at this time. The patient appears safe for discharge.  Final Clinical Impressions(s) / ED Diagnoses   Final diagnoses:  Closed nondisplaced fracture of distal phalanx of right thumb, initial encounter    New Prescriptions Discharge Medication List as of 08/25/2016  6:17 PM       Jacinto Halim,  PA-C 08/25/16 2133    Alvira Monday, MD 08/31/16 [phone]

## 2016-08-25 NOTE — ED Triage Notes (Signed)
Pt verbalizes smashing thumb in door resulting in right thumb pain and hearing of pop.

## 2016-08-25 NOTE — ED Provider Notes (Deleted)
[facility]-EMERGENCY DEPT Provider Note   CSN: [id number] Arrival date & time: 08/25/16  1853     History   Chief Complaint No chief complaint on file.   HPI Hannah [name] is a 46 y.o. female who presents to the emergency department today after smashing her right thumb in a heavy door around 8:30 AM this morning. 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 During her shift she was trying to close a door to the patient's room when the door slammed into the distal aspect of her right thumb. She immediately noted pain over the distal portion of the thumb that is throbbing in nature and has been constant since then. She denies any numbness or tingling associated with this. No decreased range of motion or weakness. She has not taken anything for this.   HPI  Past Medical History:  Diagnosis Date  . Allergy   . Cyst    supra pubic  . Pyelonephritis   . Vertigo     Patient Active Problem List   Diagnosis Date Noted  . Sebaceous cyst 04/27/2013    Past Surgical History:  Procedure Laterality Date  . BUTTOCK MASS EXCISION  2012  . CYSTECTOMY    . EXTERNAL EAR SURGERY    . inner thigh  07/28/2010   left upper thigh    OB History    No data available       Home Medications    Prior to Admission medications   Medication Sig Start Date End Date Taking? Authorizing Provider  cetirizine (ZYRTEC) 10 MG tablet Take 10 mg by mouth daily.    [provider]  Multiple Vitamins-Minerals (MULTIVITAMIN WITH MINERALS) tablet Take 1 tablet by mouth daily.    [provider]  omega-3 acid ethyl esters (LOVAZA) 1 g capsule Take by mouth 2 (two) times daily.    [provider]    Family History No family history on file.  Social History Social History  Substance Use Topics  . Smoking status: Never Smoker  . Smokeless tobacco: Never Used  . Alcohol use Yes     Allergies   Keflex [cephalexin]   Review of Systems Review of Systems  Musculoskeletal:  Positive for arthralgias (Thumb).  Skin: Negative for wound.  Neurological: Negative for weakness and numbness.     Physical Exam Updated Vital Signs LMP 08/25/2016   Physical Exam  Constitutional: She appears well-developed and well-nourished.  HENT:  Head: Normocephalic and atraumatic.  Right Ear: External ear normal.  Left Ear: External ear normal.  Eyes: Conjunctivae are normal. Right eye exhibits no discharge. Left eye exhibits no discharge. No scleral icterus.  Pulmonary/Chest: Effort normal. No respiratory distress.  Musculoskeletal:       Right wrist: Normal.  Right hand: No gross deformities, skin intact. Fingers appear normal. No TTP over flexor sheath. There is tenderness over the distal phalanx of the right thumb. Radial/Ulnar arteries 2+ with <2sec cap refill. SILT in M/U/R distributions. Dynamic 2-pt discrimination intact over median and ulnar nerve distributions on the ulnar/radial aspects of digits. Sharp/dull sensation intact at dorsal 1st webspace. Grip 5/5 strength. MCP flexion/extension intact. Finger adduction/abduction intact with 5/5 strength.  Thumb opposition intact. Full ROM to Flexion/Extension at wrist, MCP, PIP and DIP.  FDS/FDP intact.  Neurological: She is alert. She has normal strength. No sensory deficit.  Skin: Skin is warm, dry and intact. Capillary refill takes less than 2 seconds. No pallor.  Psychiatric: She has a normal mood and affect.  Nursing note and vitals reviewed.    ED Treatments / Results  Labs (all labs ordered are listed, but only abnormal results are displayed) Labs Reviewed - No data to display  EKG  EKG Interpretation None       Radiology Dg Finger Thumb Right  Result Date: 08/25/2016 CLINICAL DATA:  Right thumb injury today. EXAM: RIGHT THUMB 2+V COMPARISON:  None. FINDINGS: Small bone fragment is seen adjacent to proximal base of first distal phalanx concerning for fracture of indeterminate age. Also noted is possible  nondisplaced fracture. There is no evidence of arthropathy. Soft tissues are unremarkable IMPRESSION: Small bone fragments seen adjacent to proximal base of first distal phalanx concerning for fracture of indeterminate age. Electronically Signed   By: Lupita RaiderJames  Green Jr, M.D.   On: 08/25/2016 16:27    Procedures Procedures (including critical care time)  Medications Ordered in ED Medications - No data to display   Initial Impression / Assessment and Plan / ED Course  I have reviewed the triage vital signs and the nursing notes.  Pertinent labs & imaging results that were available during my care of the patient were reviewed by me and considered in my medical decision making (see chart for details).     46 year old female presenting today for right thumb pain after slamming thumb into door earlier this morning. Her exam is unremarkable other than for tenderness over the distal phalanx. X-rays are suggestive of fracture. Will treat the patient with a splint. Advised the patient that she will need to stay in the splint until she is seen by hand. I offered patient pain medication in the emergency department but she declined. I advised the patient to return to the emergency department with new or worsening symptoms or new concerns. Specific return precautions discussed. The patient verbalized understanding and agreement with plan. All questions answered. No further questions at this time. Patient appears safe for discharge.   Final Clinical Impressions(s) / ED Diagnoses   Final diagnoses:  Closed nondisplaced fracture of distal phalanx of right thumb, initial encounter    New Prescriptions Discharge Medication List as of 08/25/2016  6:56 PM       Jacinto HalimMaczis, Emalyn Schou M, PA-C 08/25/16 2026

## 2018-09-21 ENCOUNTER — Ambulatory Visit: Payer: PRIVATE HEALTH INSURANCE | Admitting: Podiatry

## 2018-09-28 ENCOUNTER — Ambulatory Visit: Payer: PRIVATE HEALTH INSURANCE | Admitting: Podiatry

## 2018-11-18 ENCOUNTER — Ambulatory Visit: Payer: PRIVATE HEALTH INSURANCE | Admitting: Podiatry

## 2018-11-18 ENCOUNTER — Encounter: Payer: Self-pay | Admitting: Podiatry

## 2018-11-18 ENCOUNTER — Other Ambulatory Visit: Payer: Self-pay

## 2018-11-18 ENCOUNTER — Ambulatory Visit (INDEPENDENT_AMBULATORY_CARE_PROVIDER_SITE_OTHER): Payer: PRIVATE HEALTH INSURANCE

## 2018-11-18 VITALS — BP 110/78

## 2018-11-18 DIAGNOSIS — M2012 Hallux valgus (acquired), left foot: Secondary | ICD-10-CM

## 2018-11-18 DIAGNOSIS — M79672 Pain in left foot: Secondary | ICD-10-CM

## 2018-11-18 DIAGNOSIS — M21612 Bunion of left foot: Secondary | ICD-10-CM

## 2018-11-18 DIAGNOSIS — M778 Other enthesopathies, not elsewhere classified: Secondary | ICD-10-CM | POA: Diagnosis not present

## 2018-11-20 ENCOUNTER — Encounter: Payer: Self-pay | Admitting: Podiatry

## 2018-11-20 NOTE — Progress Notes (Signed)
Subjective:  Patient ID: Hannah [name], female    DOB: 02/23/1970,  MRN: [id number]  Chief Complaint  Patient presents with  . Foot Pain    pt is here for left foot pain, pain is located on the left plantar forefoot, pain has been going on for 3 years, pt states that she has a history of bursa    48 y.o. female presents with the above complaint.  She states that the pain has been on for on and off for 3 years she also has a history of multiple bursitis for which injection has helped.  She gets a dull achy sensation in the left first big toe joint.  Patient's has tried other cortisone injection by other physicians that has helped.  Pain is elevated when applying pressure.   Review of Systems: Negative except as noted in the HPI. Denies N/V/F/Ch.  Past Medical History:  Diagnosis Date  . Allergy   . Cyst    supra pubic  . Pyelonephritis   . Vertigo     Current Outpatient Medications:  .  amoxicillin-clavulanate (AUGMENTIN) 500-125 MG tablet, Take by mouth., Disp: , Rfl:  .  cetirizine (ZYRTEC) 10 MG tablet, Take 10 mg by mouth daily., Disp: , Rfl:  .  fexofenadine (ALLEGRA) 60 MG tablet, 1 TABLET ORALLY ONCE A DAY., Disp: , Rfl:  .  Fluocin-Hydroquinone-Tretinoin 0.01-4-0.05 % CREA, Apply a pea size amount nightly  to the face except upper eyelids. Skip a few nights if you get too dry.6 weeks on and off, Disp: , Rfl:  .  fluticasone (FLONASE) 50 MCG/ACT nasal spray, USE 1 TO 2 SPRAYS NASALLY ONCE A DAY, FOR 30 DAYS., Disp: , Rfl:  .  HYDROcodone-guaiFENesin (FLOWTUSS) 2.5-200 MG/5ML SOLN, TAKE 1 TEASPOONFUL TWICE A DAY AS NEEDED, Disp: , Rfl:  .  Multiple Vitamins-Minerals (MULTIVITAMIN WITH MINERALS) tablet, Take 1 tablet by mouth daily., Disp: , Rfl:  .  omega-3 acid ethyl esters (LOVAZA) 1 g capsule, Take by mouth 2 (two) times daily., Disp: , Rfl:   Social History   Tobacco Use  Smoking Status Never Smoker  Smokeless Tobacco Never Used    Allergies  Allergen Reactions  .  Keflex [Cephalexin] Itching   Objective:   Vitals:   11/18/18 1318  BP: 110/78   There is no height or weight on file to calculate BMI. Constitutional Well developed. Well nourished.  Vascular Dorsalis pedis pulses palpable bilaterally. Posterior tibial pulses palpable bilaterally. Capillary refill normal to all digits.  No cyanosis or clubbing noted. Pedal hair growth normal.  Neurologic Normal speech. Oriented to person, place, and time. Epicritic sensation to light touch grossly present bilaterally.  Dermatologic Nails well groomed and normal in appearance. No open wounds. No skin lesions.  Orthopedic:  Left first MPJ range of motion is limited.  Pain on palpation at the left first MPJ.  Bunion deformity noted at the left first MPJ.  There is a hallux deviation laterally.  Intra-articular joint pain noted left first MPJ   Radiographs: 2 views of skeletally mature left foot: Mild bunion deformity noted with increase in IM angle moderate in nature.  Lateral deviation of the hallux.  Mild subchondral sclerosis noted.  There is increasing soft tissue pain density on the medial eminence with hypertrophy of the medial eminence. Assessment:   1. Foot pain, left   2. Bunion of left foot   3. Capsulitis of foot, left    Plan:  Patient was evaluated and treated and all  questions answered.  Left first MPJ bunion deformity with capsulitis/bursa -Given that patient has had steroid injections in the past with last one given about a year ago.  I recommended that patient would benefit from another steroid injection for relief. -A steroid injection was performed at left first MPJ using 1% plain Lidocaine and 10 mg of Kenalog. This was well tolerated. -Also explained to the patient that she would benefit from orthotics to help control the biomechanics of the foot.  Patient states that she is interested in orthotics.  She will follow up with Raiford Noble next week.  I will reach out to [facility] to see if the  orthotics are covered by her insurance.   No follow-ups on file.

## 2018-11-21 ENCOUNTER — Telehealth: Payer: Self-pay | Admitting: Podiatry

## 2018-11-21 ENCOUNTER — Other Ambulatory Visit: Payer: Self-pay | Admitting: Podiatry

## 2018-11-21 DIAGNOSIS — M2012 Hallux valgus (acquired), left foot: Secondary | ICD-10-CM

## 2018-11-21 NOTE — Telephone Encounter (Addendum)
Pt left message asking about the over the counter orthotics to wear until she gets the other pair made. She was in Friday and had an injection.    Returned call and left message that the over the counter orthotics we sell are called powersteps and they are 55.00 plus tax and she can just stop in the office and tell the front desk she is wanting to purchase them.    Pt is also aware her insurance is out of network with [place] and they will not cover orthotics. She is aware of the cost also.

## 2018-11-21 NOTE — Telephone Encounter (Signed)
That sounds good thank you!

## 2018-11-21 NOTE — Telephone Encounter (Signed)
Pt returned call and is going to come in tomorrow to pick up the powersteps.

## 2018-11-24 DIAGNOSIS — M79676 Pain in unspecified toe(s): Secondary | ICD-10-CM

## 2018-11-29 ENCOUNTER — Other Ambulatory Visit: Payer: PRIVATE HEALTH INSURANCE | Admitting: Orthotics

## 2019-01-05 ENCOUNTER — Other Ambulatory Visit: Payer: Self-pay | Admitting: Obstetrics and Gynecology

## 2019-01-05 DIAGNOSIS — N6489 Other specified disorders of breast: Secondary | ICD-10-CM

## 2019-01-13 ENCOUNTER — Ambulatory Visit
Admission: RE | Admit: 2019-01-13 | Discharge: 2019-01-13 | Disposition: A | Discharge status: Home / Self Care | Payer: PRIVATE HEALTH INSURANCE | Source: Ambulatory Visit | Attending: Obstetrics and Gynecology | Admitting: Obstetrics and Gynecology

## 2019-01-13 ENCOUNTER — Other Ambulatory Visit: Payer: Self-pay

## 2019-01-13 DIAGNOSIS — N6489 Other specified disorders of breast: Secondary | ICD-10-CM

## 2019-02-19 ENCOUNTER — Other Ambulatory Visit: Payer: Self-pay

## 2019-02-19 ENCOUNTER — Encounter (HOSPITAL_COMMUNITY): Payer: Self-pay | Admitting: Emergency Medicine

## 2019-02-19 ENCOUNTER — Emergency Department (HOSPITAL_COMMUNITY)
Admission: EM | Admit: 2019-02-19 | Discharge: 2019-02-19 | Disposition: A | Discharge status: Home / Self Care | Payer: PRIVATE HEALTH INSURANCE | Attending: Emergency Medicine | Admitting: Emergency Medicine

## 2019-02-19 DIAGNOSIS — Z79899 Other long term (current) drug therapy: Secondary | ICD-10-CM | POA: Insufficient documentation

## 2019-02-19 DIAGNOSIS — R2241 Localized swelling, mass and lump, right lower limb: Secondary | ICD-10-CM | POA: Diagnosis present

## 2019-02-19 DIAGNOSIS — M25471 Effusion, right ankle: Secondary | ICD-10-CM

## 2019-02-19 NOTE — ED Notes (Signed)
An After Visit Summary was printed and given to the patient. Discharge instructions given and no further questions at this time.  

## 2019-02-19 NOTE — Discharge Instructions (Signed)
IMPORTANT PATIENT INSTRUCTIONS:  You have been scheduled for an Outpatient Vascular Study at [facility].    If tomorrow is a Saturday, Sunday or holiday, please go to the [facility] Emergency Department Registration Desk at 11 am tomorrow morning and tell them you are there for a vascular study.   If tomorrow is a weekday (Monday-Friday), please go to [facility] Registration at 11 am and tell them you are there for a vascular study.  Above are the instructions for your ultrasound tomorrow. If you pain returns, you may take over the counter tylenol or ibuprofen. I recommend calling Dr. Lysle Dingwall tomorrow to fill him in on your progress. Return to the ER sooner for new or worsening symptoms.

## 2019-02-19 NOTE — ED Triage Notes (Signed)
Patient had some varicose veins removed on there right leg on Thursday and now she is complaining of right ankle swelling.

## 2019-02-19 NOTE — ED Provider Notes (Signed)
[facility]-EMERGENCY DEPT Provider Note   CSN: [id number] Arrival date & time: 02/19/19  1901     History Chief Complaint  Patient presents with  . Joint Swelling    Hannah [name] is a 49 y.o. female with a past medical history significant for season allergies who presents to the ED due to sudden onset of right ankle swelling that started this afternoon. Chart reviewed. Patient had sclerotherapy performed on 02/13/19 with Dr. Lysle Dingwall on her right thigh, right calf, and right foot. Patient admits to lingering pain for the past few days after the procedure which has caused her to miss work numerous days. Today, she drove to [place] and ran errands and noticed her right ankle began to swell at the end of the day. Patient denies pain, but is extremely worried about a potential blood clot. Patient notes she called the ED and spoke to a nurse who recommended coming in to rule out a blood clot. Patient denies history of blood clots, hormonal treatment, and recent immobilizations. Patient has intermittently taken ibuprofen for her leg pain. Patient denies right ankle injury. Patient denies fever, chills, drainage from procedure sites, chest pain, shortness of breath, abdominal pain, nausea, and vomiting.      Past Medical History:  Diagnosis Date  . Allergy   . Cyst    supra pubic  . Pyelonephritis   . Vertigo     Patient Active Problem List   Diagnosis Date Noted  . Sebaceous cyst 04/27/2013    Past Surgical History:  Procedure Laterality Date  . BUTTOCK MASS EXCISION  2012  . CYSTECTOMY    . EXTERNAL EAR SURGERY    . inner thigh  07/28/2010   left upper thigh     OB History   No obstetric history on file.     History reviewed. No pertinent family history.  Social History   Tobacco Use  . Smoking status: Never Smoker  . Smokeless tobacco: Never Used  Substance Use Topics  . Alcohol use: Yes  . Drug use: No    Home Medications Prior to Admission  medications   Medication Sig Start Date End Date Taking? Authorizing Provider  amoxicillin-clavulanate (AUGMENTIN) 500-125 MG tablet Take by mouth. 04/27/15   [provider]  cetirizine (ZYRTEC) 10 MG tablet Take 10 mg by mouth daily.    [provider]  fexofenadine (ALLEGRA) 60 MG tablet 1 TABLET ORALLY ONCE A DAY. 11/13/14   [provider]  Fluocin-Hydroquinone-Tretinoin 0.01-4-0.05 % CREA Apply a pea size amount nightly  to the face except upper eyelids. Skip a few nights if you get too dry.6 weeks on and off 12/24/14   [provider]  fluticasone (FLONASE) 50 MCG/ACT nasal spray USE 1 TO 2 SPRAYS NASALLY ONCE A DAY, FOR 30 DAYS. 11/13/14   [provider]  HYDROcodone-guaiFENesin (FLOWTUSS) 2.5-200 MG/5ML SOLN TAKE 1 TEASPOONFUL TWICE A DAY AS NEEDED 03/14/15   [provider]  Multiple Vitamins-Minerals (MULTIVITAMIN WITH MINERALS) tablet Take 1 tablet by mouth daily.    [provider]  omega-3 acid ethyl esters (LOVAZA) 1 g capsule Take by mouth 2 (two) times daily.    [provider]    Allergies    Keflex [cephalexin]  Review of Systems   Review of Systems  Constitutional: Negative for chills and fever.  Respiratory: Negative for shortness of breath.   Cardiovascular: Positive for leg swelling (right ankle). Negative for chest pain.  Gastrointestinal: Negative for abdominal  pain, diarrhea, nausea and vomiting.  Musculoskeletal: Positive for joint swelling (right ankle). Negative for gait problem.  Skin: Negative for color change.  Neurological: Negative for weakness and numbness.  All other systems reviewed and are negative.   Physical Exam Updated Vital Signs BP 138/90 (BP Location: Right Arm)   Pulse 81   Temp 98.8 F (37.1 C) (Oral)   Resp 20   Ht 5\' 6"  (1.676 m)   Wt 81.6 kg   LMP 02/16/2019   SpO2 100%   BMI 29.05 kg/m   Physical Exam Vitals and nursing note reviewed.  Constitutional:       General: She is not in acute distress.    Appearance: She is not ill-appearing.  HENT:     Head: Normocephalic.  Eyes:     Pupils: Pupils are equal, round, and reactive to light.  Cardiovascular:     Rate and Rhythm: Normal rate and regular rhythm.     Pulses: Normal pulses.     Heart sounds: Normal heart sounds. No murmur. No friction rub. No gallop.   Pulmonary:     Effort: Pulmonary effort is normal.     Breath sounds: Normal breath sounds.  Abdominal:     General: Abdomen is flat. Bowel sounds are normal. There is no distension.     Palpations: Abdomen is soft.  Musculoskeletal:     Cervical back: Neck supple.     Comments: Right ankle positive for edema on the lateral aspect. No erythema or warmth. Pedal pulses intact. Sensation intact. Full ROM of right ankle. No tenderness over calf. Negative homans sign bilaterally. Well healing varicose veins on right dorsum of foot, right calf, and right thigh.   Skin:    General: Skin is warm and dry.     Capillary Refill: Capillary refill takes less than 2 seconds.  Neurological:     General: No focal deficit present.     Mental Status: She is alert.  Psychiatric:        Mood and Affect: Mood normal.        Behavior: Behavior normal.     ED Results / Procedures / Treatments   Labs (all labs ordered are listed, but only abnormal results are displayed) Labs Reviewed - No data to display  EKG None  Radiology No results found.  Procedures Procedures (including critical care time)  Medications Ordered in ED Medications - No data to display  ED Course  I have reviewed the triage vital signs and the nursing notes.  Pertinent labs & imaging results that were available during my care of the patient were reviewed by me and considered in my medical decision making (see chart for details).    MDM Rules/Calculators/A&P                     49 year old female presents the ED due to sudden onset of right ankle swelling that  started this afternoon. Patient denies right ankle injury. Patient had a sclerotherapy procedure performed on 02/13/2019 for 3 varicose veins on her right leg. Patient denies chest pain and shortness of breath. Vitals all within normal limits. Patient in no acute distress and non-ill appearing. Right ankle positive for edema on the lateral aspect. No erythema or warmth. Pedal pulses intact. Sensation intact. Full ROM of right ankle. No tenderness over calf. Negative homans sign bilaterally. Well healing varicose veins on right dorsum of foot, right calf, and right thigh. Normal right knee and foot. Low suspicion for  DVT, but patient is very concerned about the swelling. Will have patient come back tomorrow for a DVT study. Shared decision making in regards to prophylactic Lovenox dose and patient would prefer to hold dose until results from [place] tomorrow. Patient deferred pain treatment here in the ED. Ice pack given to patient. Instructions given to patient about how to schedule an ultrasound for tomorrow. Patient also advised to call Dr. Silvio Pate tomorrow to update him on her status. Strict ED precautions discussed with patient. Patient states understanding and agrees to plan. Patient discharged home in no acute distress and stable vitals.  Discussed case with Dr. Darl Householder who agrees with assessment and plan.   Final Clinical Impression(s) / ED Diagnoses Final diagnoses:  Right ankle swelling    Rx / DC Orders ED Discharge Orders         Ordered    LE VENOUS     02/19/19 2032           Suzy Bouchard, PA-C 02/19/19 2227    Drenda Freeze, MD 02/19/19 2317

## 2019-02-20 ENCOUNTER — Ambulatory Visit (HOSPITAL_COMMUNITY)
Admission: RE | Admit: 2019-02-20 | Discharge: 2019-02-20 | Disposition: A | Discharge status: Home / Self Care | Payer: PRIVATE HEALTH INSURANCE | Source: Ambulatory Visit | Attending: Student | Admitting: Student

## 2019-02-20 ENCOUNTER — Encounter (HOSPITAL_COMMUNITY): Payer: Self-pay | Admitting: Student

## 2019-02-20 DIAGNOSIS — M7989 Other specified soft tissue disorders: Secondary | ICD-10-CM | POA: Diagnosis not present

## 2019-02-20 DIAGNOSIS — R600 Localized edema: Secondary | ICD-10-CM | POA: Insufficient documentation

## 2019-02-20 NOTE — Progress Notes (Signed)
Right lower extremity venous duplex completed. Refer to "CV Proc" under chart review to view preliminary results.  02/20/2019 11:53 AM Eula Fried., MHA, RVT, RDCS, RDMS

## 2019-08-17 ENCOUNTER — Other Ambulatory Visit (INDEPENDENT_AMBULATORY_CARE_PROVIDER_SITE_OTHER): Payer: Self-pay | Admitting: Otolaryngology

## 2019-10-02 ENCOUNTER — Other Ambulatory Visit: Payer: Self-pay

## 2019-10-02 ENCOUNTER — Ambulatory Visit (INDEPENDENT_AMBULATORY_CARE_PROVIDER_SITE_OTHER): Payer: PRIVATE HEALTH INSURANCE | Admitting: Otolaryngology

## 2019-10-02 VITALS — Temp 97.5°F

## 2019-10-02 DIAGNOSIS — H6121 Impacted cerumen, right ear: Secondary | ICD-10-CM | POA: Diagnosis not present

## 2019-10-02 DIAGNOSIS — L299 Pruritus, unspecified: Secondary | ICD-10-CM | POA: Diagnosis not present

## 2019-10-02 NOTE — Progress Notes (Signed)
HPI: Hannah [name] is a 49 y.o. female who returns today for evaluation of itching in her right ear.  She had a previous cyst removed from the right lateral ear canal 1 year ago.  She has had intermittent infection and itching in the ear and is used Cortisporin eardrops in the past.  She has been having some itching recently in the right ear only. She brings FMLA papers that she wanted filled out.  She is having no pain but states that she has occasional drainage in the mornings from the right ear..  Past Medical History:  Diagnosis Date   Allergy    Cyst    supra pubic   Pyelonephritis    Vertigo    Past Surgical History:  Procedure Laterality Date   BUTTOCK MASS EXCISION  2012   CYSTECTOMY     EXTERNAL EAR SURGERY     inner thigh  07/28/2010   left upper thigh   Social History   Socioeconomic History   Marital status: Single    Spouse name: Not on file   Number of children: Not on file   Years of education: Not on file   Highest education level: Not on file  Occupational History   Not on file  Tobacco Use   Smoking status: Never Smoker   Smokeless tobacco: Never Used  Substance and Sexual Activity   Alcohol use: Yes   Drug use: No   Sexual activity: Yes    Birth control/protection: None  Other Topics Concern   Not on file  Social History Narrative   Not on file   Social Determinants of Health   Financial Resource Strain:    Difficulty of Paying Living Expenses: Not on file  Food Insecurity:    Worried About Running Out of Food in the Last Year: Not on file   [employer] of Food in the Last Year: Not on file  Transportation Needs:    Lack of Transportation (Medical): Not on file   Lack of Transportation (Non-Medical): Not on file  Physical Activity:    Days of Exercise per Week: Not on file   Minutes of Exercise per Session: Not on file  Stress:    Feeling of Stress : Not on file  Social Connections:    Frequency of Communication  with Friends and Family: Not on file   Frequency of Social Gatherings with Friends and Family: Not on file   Attends Religious Services: Not on file   Active Member of Clubs or Organizations: Not on file   Attends Banker Meetings: Not on file   Marital Status: Not on file   No family history on file. Allergies  Allergen Reactions   Keflex [Cephalexin] Itching   Prior to Admission medications   Medication Sig Start Date End Date Taking? Authorizing Provider  amoxicillin-clavulanate (AUGMENTIN) 500-125 MG tablet Take by mouth. 04/27/15  Yes [provider]  cetirizine (ZYRTEC) 10 MG tablet Take 10 mg by mouth daily.   Yes [provider]  fexofenadine (ALLEGRA) 60 MG tablet 1 TABLET ORALLY ONCE A DAY. 11/13/14  Yes [provider]  Fluocin-Hydroquinone-Tretinoin 0.01-4-0.05 % CREA Apply a pea size amount nightly  to the face except upper eyelids. Skip a few nights if you get too dry.6 weeks on and off 12/24/14  Yes [provider]  fluticasone (FLONASE) 50 MCG/ACT nasal spray USE 1 TO 2 SPRAYS NASALLY ONCE A DAY, FOR 30 DAYS. 11/13/14  Yes [provider]  HYDROcodone-guaiFENesin (FLOWTUSS) 2.5-200 MG/5ML SOLN TAKE 1 TEASPOONFUL TWICE A DAY AS NEEDED 03/14/15  Yes [provider]  Multiple Vitamins-Minerals (MULTIVITAMIN WITH MINERALS) tablet Take 1 tablet by mouth daily.   Yes [provider]  omega-3 acid ethyl esters (LOVAZA) 1 g capsule Take by mouth 2 (two) times daily.   Yes [provider]     Positive ROS: Otherwise negative  All other systems have been reviewed and were otherwise negative with the exception of those mentioned in the HPI and as above.  Physical Exam: Constitutional: Alert, well-appearing, no acute distress Ears: External ears without lesions or tenderness.  Left ear canal and TM are clear.  Right ear canal is a little bit smaller with some wax deep within the ear canal that  was removed with a curette.  Otherwise there is no external otitis the TMs are clear bilaterally.  The previous excision of the cyst performed on the right side is well-healed with no evidence of recurrent cyst. Nasal: External nose without lesions. Clear nasal passages Oral: Lips and gums without lesions. Tongue and palate mucosa without lesions. Posterior oropharynx clear. Neck: No palpable adenopathy or masses Respiratory: Breathing comfortably  Skin: No facial/neck lesions or rash noted.  Cerumen impaction removal  Date/Time: 10/02/2019 2:47 PM Performed by: Drema Halon, MD Authorized by: Drema Halon, MD   Consent:    Consent obtained:  Verbal   Consent given by:  Patient   Risks discussed:  Pain and bleeding Procedure details:    Location:  R ear   Procedure type: curette   Post-procedure details:    Inspection:  TM intact and canal normal   Hearing quality:  Improved   Patient tolerance of procedure:  Tolerated well, no immediate complications Comments:     TMs are clear bilaterally.    Assessment: Chronic itching in the right ear. Wax buildup in the right ear canal.  Plan: Reassured her of no signs of infection. For the itching in the ear canal suggested trial of alcohol vinegar ear rinses versus the antibiotic eardrops. Also gave her a prescription for doxycycline 100 mg twice daily for a week if she develops any chronic pain or recurrence of the cyst that she is concerned about a recurrent cyst.  But the excision site is well-healed with no evidence of recurrence on clinical exam today.   Narda Bonds, MD

## 2019-11-03 ENCOUNTER — Ambulatory Visit: Payer: PRIVATE HEALTH INSURANCE | Admitting: Podiatry

## 2019-11-13 ENCOUNTER — Ambulatory Visit (INDEPENDENT_AMBULATORY_CARE_PROVIDER_SITE_OTHER): Payer: PRIVATE HEALTH INSURANCE | Admitting: Podiatry

## 2019-11-13 ENCOUNTER — Ambulatory Visit (INDEPENDENT_AMBULATORY_CARE_PROVIDER_SITE_OTHER): Payer: PRIVATE HEALTH INSURANCE

## 2019-11-13 ENCOUNTER — Other Ambulatory Visit: Payer: Self-pay

## 2019-11-13 ENCOUNTER — Encounter: Payer: Self-pay | Admitting: Podiatry

## 2019-11-13 DIAGNOSIS — M21611 Bunion of right foot: Secondary | ICD-10-CM

## 2019-11-13 DIAGNOSIS — M2011 Hallux valgus (acquired), right foot: Secondary | ICD-10-CM

## 2019-11-13 DIAGNOSIS — M7752 Other enthesopathy of left foot: Secondary | ICD-10-CM | POA: Diagnosis not present

## 2019-11-13 DIAGNOSIS — Q66229 Congenital metatarsus adductus, unspecified foot: Secondary | ICD-10-CM

## 2019-11-13 NOTE — Progress Notes (Signed)
  Subjective:  Patient ID: Hannah [name], female    DOB: 1970-10-14,  MRN: [id number]  Chief Complaint  Patient presents with  . Foot Pain    Left foot pain follow up. PT stated that she is doing a little better. She had to get rid of alot of her shoes because of the pressure when she wore them, She said icing it helped more than anything     49 y.o. female presents with the above complaint. History confirmed with patient.  She previously saw Dr. Posey Pronto for this about 1 year ago.  He performed an injection of the bursa over the first metatarsophalangeal joint over the bunion and this was very helpful.  Her pain has come back.  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  Objective:  Physical Exam: warm, good capillary refill, no trophic changes or ulcerative lesions, normal DP and PT pulses and normal sensory exam. Left Foot: Mild hallux valgus with prominent dorsomedial eminence which is painful to touch.,  Tenderness over the dorsal medial cutaneous nerve proper digital branch to the hallux  Radiographs: X-ray of the left foot: Mild hallux valgus deformity with a large dorsomedial eminence and mild metatarsus adductus deformity Assessment:   1. Hallux valgus with bunions, right   2. Metatarsus adductus      Plan:  Patient was evaluated and treated and all questions answered.  Discussed etiology and treatment options detail for bony deformity.  Also discussed with her how her met adductus deformity contributes this as well.  We discussed both surgical and nonsurgical treatment options.  So far nonsurgical option such as wider shoe gears and bunion splints have not helped.  An injection over the bursa did help last year and she requested this today and we did perform this as noted below.    We also discussed that this is likely not a good long-term solution and that the deformity is likely continue to worsen.  She is interested in surgical  intervention.  I discussed with her and reviewed her x-rays with her and feel that a distal metatarsal osteotomy would be sufficient to correct her deformity as her primary complaint is pain over the bump and she does have mild intermetatarsal angle deviation and subluxation of the sesamoids.  Also discussed with her that a Lapidus would be a reasonable choice as well.  She does have some mobility though not excessive in the first tarsometatarsal joint and this would likely reduce her risk of recurrence as well as address the met primus varus component too.  She will consider this and discuss with her job and will plan for surgery accordingly.  After sterile prep with povidone-iodine solution and alcohol, the bursa overlying the dorsal medial eminence of the left first metatarsophalangeal joint was injected with 0.5cc 2% xylocaine plain, 0.5cc 0.5% marcaine plain, 20m triamcinolone acetonide, and 244mdexamethasone was injected. The patient tolerated the procedure well without complication.   Return in about 1 month (around 12/14/2019).

## 2019-12-12 ENCOUNTER — Other Ambulatory Visit (INDEPENDENT_AMBULATORY_CARE_PROVIDER_SITE_OTHER): Payer: Self-pay | Admitting: Otolaryngology

## 2019-12-14 ENCOUNTER — Ambulatory Visit: Payer: PRIVATE HEALTH INSURANCE | Admitting: Podiatry

## 2019-12-15 ENCOUNTER — Ambulatory Visit: Payer: PRIVATE HEALTH INSURANCE | Admitting: Podiatry

## 2020-03-04 ENCOUNTER — Ambulatory Visit (INDEPENDENT_AMBULATORY_CARE_PROVIDER_SITE_OTHER): Payer: PRIVATE HEALTH INSURANCE | Admitting: Podiatry

## 2020-03-04 ENCOUNTER — Other Ambulatory Visit: Payer: Self-pay

## 2020-03-04 DIAGNOSIS — Z5321 Procedure and treatment not carried out due to patient leaving prior to being seen by health care provider: Secondary | ICD-10-CM

## 2020-03-04 NOTE — Progress Notes (Signed)
Patient stated she needed to reschedule her appointment after being roomed, no charges filed, advised her to reschedule at the front desk or call for new appointment when she is able

## 2020-03-07 ENCOUNTER — Ambulatory Visit: Payer: PRIVATE HEALTH INSURANCE | Admitting: Podiatry

## 2020-03-08 ENCOUNTER — Other Ambulatory Visit: Payer: Self-pay

## 2020-03-08 ENCOUNTER — Ambulatory Visit (INDEPENDENT_AMBULATORY_CARE_PROVIDER_SITE_OTHER): Payer: PRIVATE HEALTH INSURANCE | Admitting: Podiatry

## 2020-03-08 DIAGNOSIS — M21612 Bunion of left foot: Secondary | ICD-10-CM

## 2020-03-12 ENCOUNTER — Encounter: Payer: Self-pay | Admitting: Podiatry

## 2020-03-12 NOTE — Progress Notes (Signed)
Subjective:  Patient ID: Hannah [name], female    DOB: Feb 17, 1970,  MRN: [id number]  Chief Complaint  Patient presents with  . Foot Pain    Right foot pain PT stated that she is here to just have her toe checked because she has been having some pain     50 y.o. female presents with the above complaint.  Patient presents with a complaint of left first MPJ bursitis/bunion pain.  Patient states that she received an injection by Dr. Lilian Kapur last month which seems to help a little bit.  She states she still has some occasional pain but overall doing better.  She just wants to get it treated and wants to discuss surgical interventions to have it evaluated.  She states that she is moving to [facility] will not be able to follow-up afterwards.  She denies any other acute complaints.   Review of Systems: Negative except as noted in the HPI. Denies N/V/F/Ch.  Past Medical History:  Diagnosis Date  . Allergy   . Cyst    supra pubic  . Pyelonephritis   . Vertigo     Current Outpatient Medications:  .  amoxicillin-clavulanate (AUGMENTIN) 500-125 MG tablet, Take by mouth., Disp: , Rfl:  .  B Complex-C-Biotin-E-Min-FA (DIALYVITE 5000) 5 MG TABS, Take by mouth., Disp: , Rfl:  .  cetirizine (ZYRTEC) 10 MG tablet, Take 10 mg by mouth daily., Disp: , Rfl:  .  ergocalciferol (VITAMIN D2) 1.25 MG (50000 UT) capsule, TAKE 1 CAPSULE BY MOUTH ONE TIME PER WEEK, Disp: , Rfl:  .  ferrous sulfate 325 (65 FE) MG tablet, TAKE 1 TABLET BY MOUTH TWICE A DAY X 30 DAYS, Disp: , Rfl:  .  fexofenadine (ALLEGRA) 60 MG tablet, 1 TABLET ORALLY ONCE A DAY., Disp: , Rfl:  .  Fluocin-Hydroquinone-Tretinoin 0.01-4-0.05 % CREA, Apply a pea size amount nightly  to the face except upper eyelids. Skip a few nights if you get too dry.6 weeks on and off, Disp: , Rfl:  .  fluticasone (FLONASE) 50 MCG/ACT nasal spray, INSTILL 2 SPRAYS IN EACH NOSTRIL EVERYDAY AT NIGHT, Disp: 16 mL, Rfl: 6 .  HYDROcodone-guaiFENesin (FLOWTUSS)  2.5-200 MG/5ML SOLN, TAKE 1 TEASPOONFUL TWICE A DAY AS NEEDED, Disp: , Rfl:  .  levothyroxine (SYNTHROID) 50 MCG tablet, Take 1 tablet Monday-Friday and 2 tablets Saturday and Sunday, Disp: , Rfl:  .  metroNIDAZOLE (FLAGYL) 500 MG tablet, Take 500 mg by mouth 2 (two) times daily., Disp: , Rfl:  .  metroNIDAZOLE (METROGEL) 0.75 % vaginal gel, Place vaginally at bedtime., Disp: , Rfl:  .  Multiple Vitamins-Minerals (MULTIVITAMIN WITH MINERALS) tablet, Take 1 tablet by mouth daily., Disp: , Rfl:  .  mupirocin ointment (BACTROBAN) 2 %, mupirocin 2 % topical ointment  ALSO APPLY SMALL AMOUNT TO AFFECTED AREA TWICE DAILY, Disp: , Rfl:  .  omega-3 acid ethyl esters (LOVAZA) 1 g capsule, Take by mouth 2 (two) times daily., Disp: , Rfl:  .  Vitamin D, Ergocalciferol, (DRISDOL) 1.25 MG (50000 UNIT) CAPS capsule, Take 50,000 Units by mouth once a week., Disp: , Rfl:   Social History   Tobacco Use  Smoking Status Never Smoker  Smokeless Tobacco Never Used    Allergies  Allergen Reactions  . Keflex [Cephalexin] Itching   Objective:  There were no vitals filed for this visit. There is no height or weight on file to calculate BMI. Constitutional Well developed. Well nourished.  Vascular Dorsalis pedis pulses palpable bilaterally. Posterior tibial  pulses palpable bilaterally. Capillary refill normal to all digits.  No cyanosis or clubbing noted. Pedal hair growth normal.  Neurologic Normal speech. Oriented to person, place, and time. Epicritic sensation to light touch grossly present bilaterally.  Dermatologic Nails well groomed and normal in appearance. No open wounds. No skin lesions.  Orthopedic:  Very mild pain on palpation to the left first metatarsophalangeal joint.  Bunion deformity noted bilaterally with left side greater than right side intra-articular pain mildly noted.  Pain on palpation to the medial eminence.   Radiographs: None Assessment:   1. Bunion of left foot    Plan:   Patient was evaluated and treated and all questions answered.  Left hallux valgus bunion deformity with underlying bursitis -I explained to the patient the etiology of bunion pain and various treatment options were discussed.  I also discussed with her shoe gear modification orthotics management.  Given that she will be moving to [facility] in the near future at this time we can hold off on surgical procedure as her pain is very mild in nature.  I discussed with the patient that she can follow-up with another podiatrist However I am more than happy to see her whenever she is back in town.  She states understanding. -For now we will hold off on any kind of surgical procedure she is moving away.  I will see her back as needed  No follow-ups on file.

## 2020-04-06 ENCOUNTER — Other Ambulatory Visit (INDEPENDENT_AMBULATORY_CARE_PROVIDER_SITE_OTHER): Payer: Self-pay | Admitting: Otolaryngology

## 2020-10-06 ENCOUNTER — Other Ambulatory Visit (INDEPENDENT_AMBULATORY_CARE_PROVIDER_SITE_OTHER): Payer: Self-pay | Admitting: Otolaryngology

## 2021-04-04 ENCOUNTER — Other Ambulatory Visit: Payer: Self-pay | Admitting: Obstetrics and Gynecology

## 2021-04-04 DIAGNOSIS — R928 Other abnormal and inconclusive findings on diagnostic imaging of breast: Secondary | ICD-10-CM

## 2021-04-22 ENCOUNTER — Ambulatory Visit
Admission: RE | Admit: 2021-04-22 | Discharge: 2021-04-22 | Disposition: A | Discharge status: Home / Self Care | Payer: PRIVATE HEALTH INSURANCE | Source: Ambulatory Visit | Attending: Obstetrics and Gynecology | Admitting: Obstetrics and Gynecology

## 2021-04-22 ENCOUNTER — Other Ambulatory Visit: Payer: Self-pay | Admitting: Obstetrics and Gynecology

## 2021-04-22 DIAGNOSIS — N6310 Unspecified lump in the right breast, unspecified quadrant: Secondary | ICD-10-CM

## 2021-04-22 DIAGNOSIS — R928 Other abnormal and inconclusive findings on diagnostic imaging of breast: Secondary | ICD-10-CM

## 2021-04-29 ENCOUNTER — Other Ambulatory Visit: Payer: Self-pay

## 2021-04-29 ENCOUNTER — Ambulatory Visit
Admission: RE | Admit: 2021-04-29 | Discharge: 2021-04-29 | Disposition: A | Discharge status: Home / Self Care | Payer: PRIVATE HEALTH INSURANCE | Source: Ambulatory Visit | Attending: Obstetrics and Gynecology | Admitting: Obstetrics and Gynecology

## 2021-04-29 ENCOUNTER — Other Ambulatory Visit: Payer: Self-pay | Admitting: Diagnostic Radiology

## 2021-04-29 DIAGNOSIS — N6310 Unspecified lump in the right breast, unspecified quadrant: Secondary | ICD-10-CM

## 2022-07-22 NOTE — Progress Notes (Deleted)
   Office Visit Note  Patient: Hannah [name]             Date of Birth: [birth year]           MRN: [id number]             PCP: Kaleen Mask, MD Referring: Kaleen Mask, * Visit Date: 07/23/2022 Occupation: @GUAROCC @  Subjective:  No chief complaint on file.   History of Present Illness: Hannah [name] is a 52 y.o. female ***   Labs reviewed 03/2022 ANA neg RF neg ESR 50  Activities of Daily Living:  Patient reports morning stiffness for *** {minute/hour:19697}.   Patient {ACTIONS;DENIES/REPORTS:21021675::"Denies"} nocturnal pain.  Difficulty dressing/grooming: {ACTIONS;DENIES/REPORTS:21021675::"Denies"} Difficulty climbing stairs: {ACTIONS;DENIES/REPORTS:21021675::"Denies"} Difficulty getting out of chair: {ACTIONS;DENIES/REPORTS:21021675::"Denies"} Difficulty using hands for taps, buttons, cutlery, and/or writing: {ACTIONS;DENIES/REPORTS:21021675::"Denies"}  No Rheumatology ROS completed.   PMFS History:  Patient Active Problem List   Diagnosis Date Noted   Sebaceous cyst 04/27/2013    Past Medical History:  Diagnosis Date   Allergy    Cyst    supra pubic   Pyelonephritis    Vertigo     No family history on file. Past Surgical History:  Procedure Laterality Date   BUTTOCK MASS EXCISION  2012   CYSTECTOMY     EXTERNAL EAR SURGERY     inner thigh  07/28/2010   left upper thigh   Social History   Social History Narrative   Not on file   Immunization History  Administered Date(s) Administered   PFIZER(Purple Top)SARS-COV-2 Vaccination 10/31/2019, 11/21/2019     Objective: Vital Signs: There were no vitals taken for this visit.   Physical Exam   Musculoskeletal Exam: ***  CDAI Exam: CDAI Score: -- Patient Global: --; Provider Global: -- Swollen: --; Tender: -- Joint Exam 07/23/2022   No joint exam has been documented for this visit   There is currently no information documented on the homunculus. Go to the Rheumatology activity  and complete the homunculus joint exam.  Investigation: No additional findings.  Imaging: No results found.  Recent Labs: Lab Results  Component Value Date   WBC 6.6 11/22/2011   HGB 11.3 (L) 11/22/2011   PLT 261 11/22/2011   NA 137 11/22/2011   K 3.5 11/22/2011   CL 103 11/22/2011   CO2 24 11/22/2011   GLUCOSE 81 11/22/2011   BUN 6 11/22/2011   CREATININE 0.66 11/22/2011   CALCIUM 9.1 11/22/2011   GFRAA >90 11/22/2011    Speciality Comments: No specialty comments available.  Procedures:  No procedures performed Allergies: Keflex [cephalexin]   Assessment / Plan:     Visit Diagnoses: No diagnosis found.  Orders: No orders of the defined types were placed in this encounter.  No orders of the defined types were placed in this encounter.   Face-to-face time spent with patient was *** minutes. Greater than 50% of time was spent in counseling and coordination of care.  Follow-Up Instructions: No follow-ups on file.   Fuller Plan, MD  Note - This record has been created using [employer].  Chart creation errors have been sought, but may not always  have been located. Such creation errors do not reflect on  the standard of medical care.

## 2022-07-23 ENCOUNTER — Encounter: Payer: PRIVATE HEALTH INSURANCE | Admitting: Internal Medicine

## 2023-12-24 DIAGNOSIS — H524 Presbyopia: Secondary | ICD-10-CM | POA: Diagnosis not present

## 2024-03-13 IMAGING — MG MM BREAST LOCALIZATION CLIP
4 series · 4 of 12 positions shown · non-contrast
Comparison: Previous exam(s).

CLINICAL DATA: Status post right breast ultrasound biopsy.

EXAM:
3D DIAGNOSTIC RIGHT MAMMOGRAM POST ULTRASOUND BIOPSY

[R CC synth-2D]
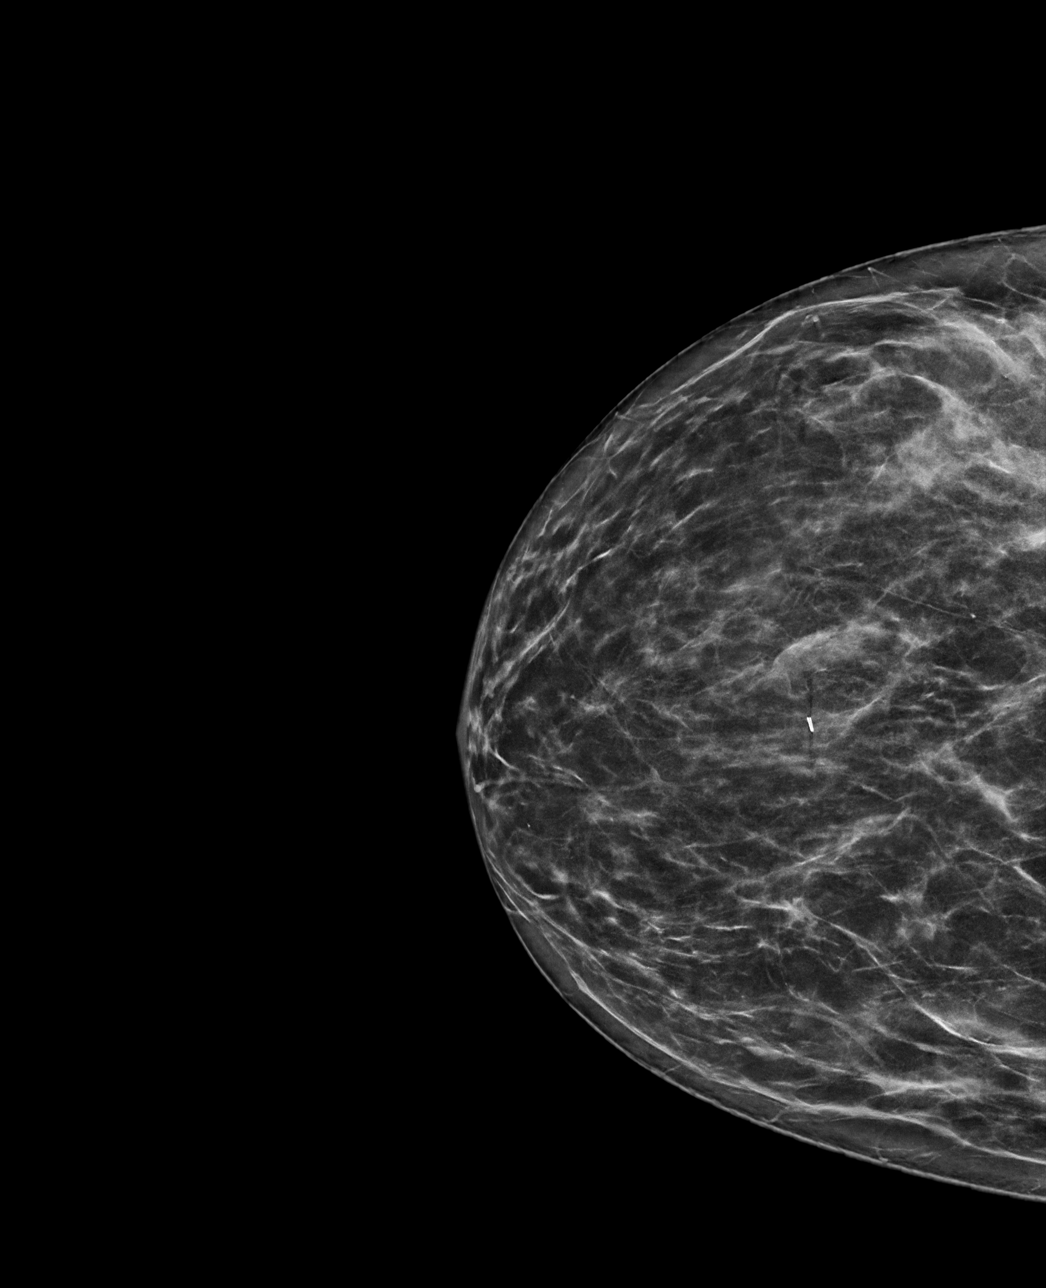

[R ML synth-2D]
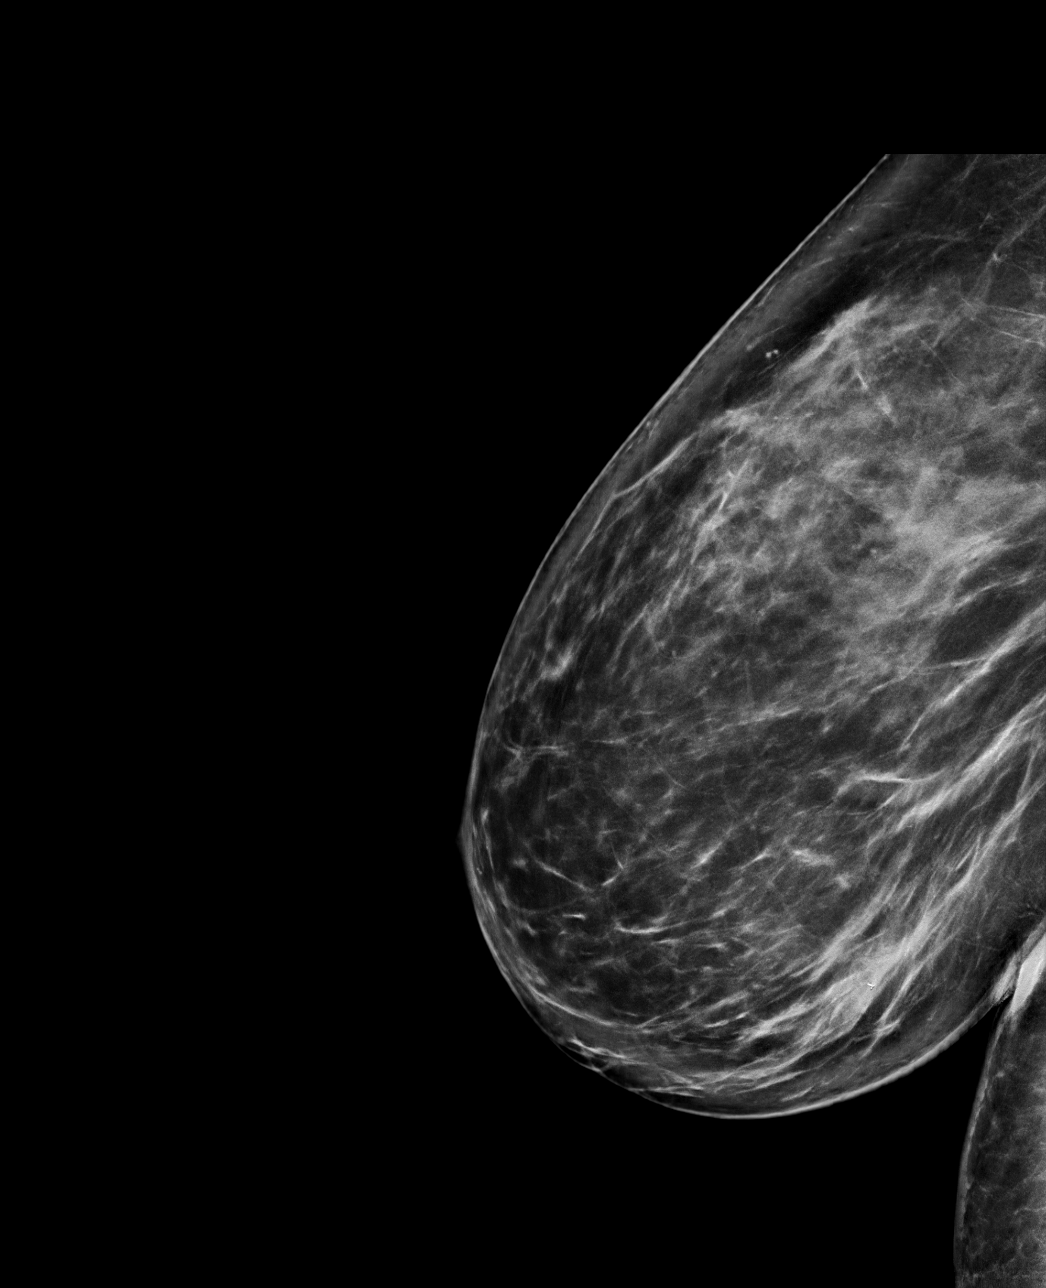

[R CC tomo · tomo slice 37/72.0]
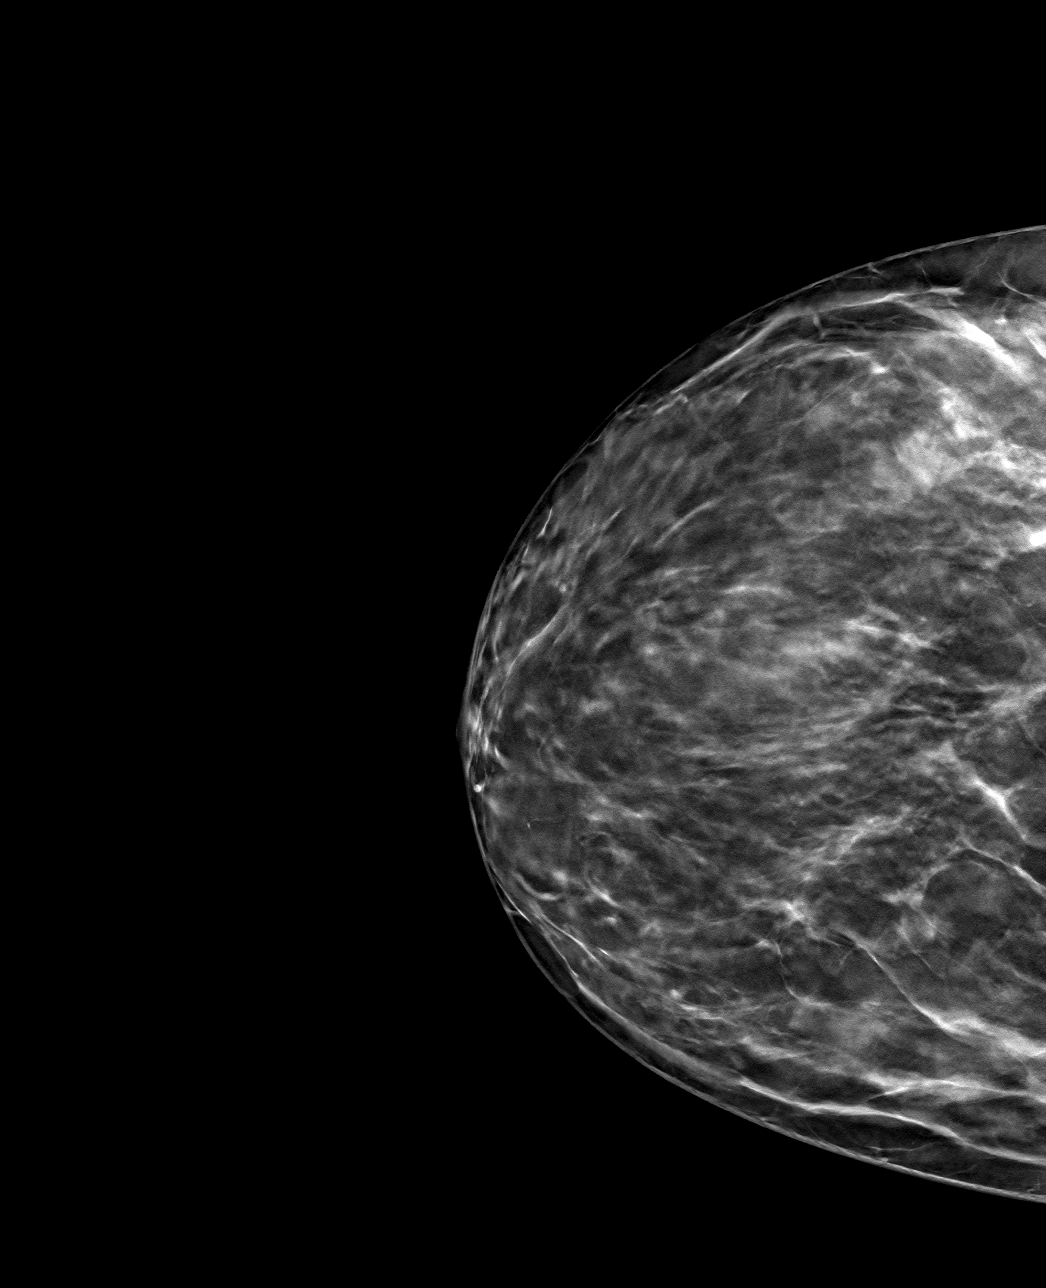

[R ML tomo · tomo slice 48/95.0]
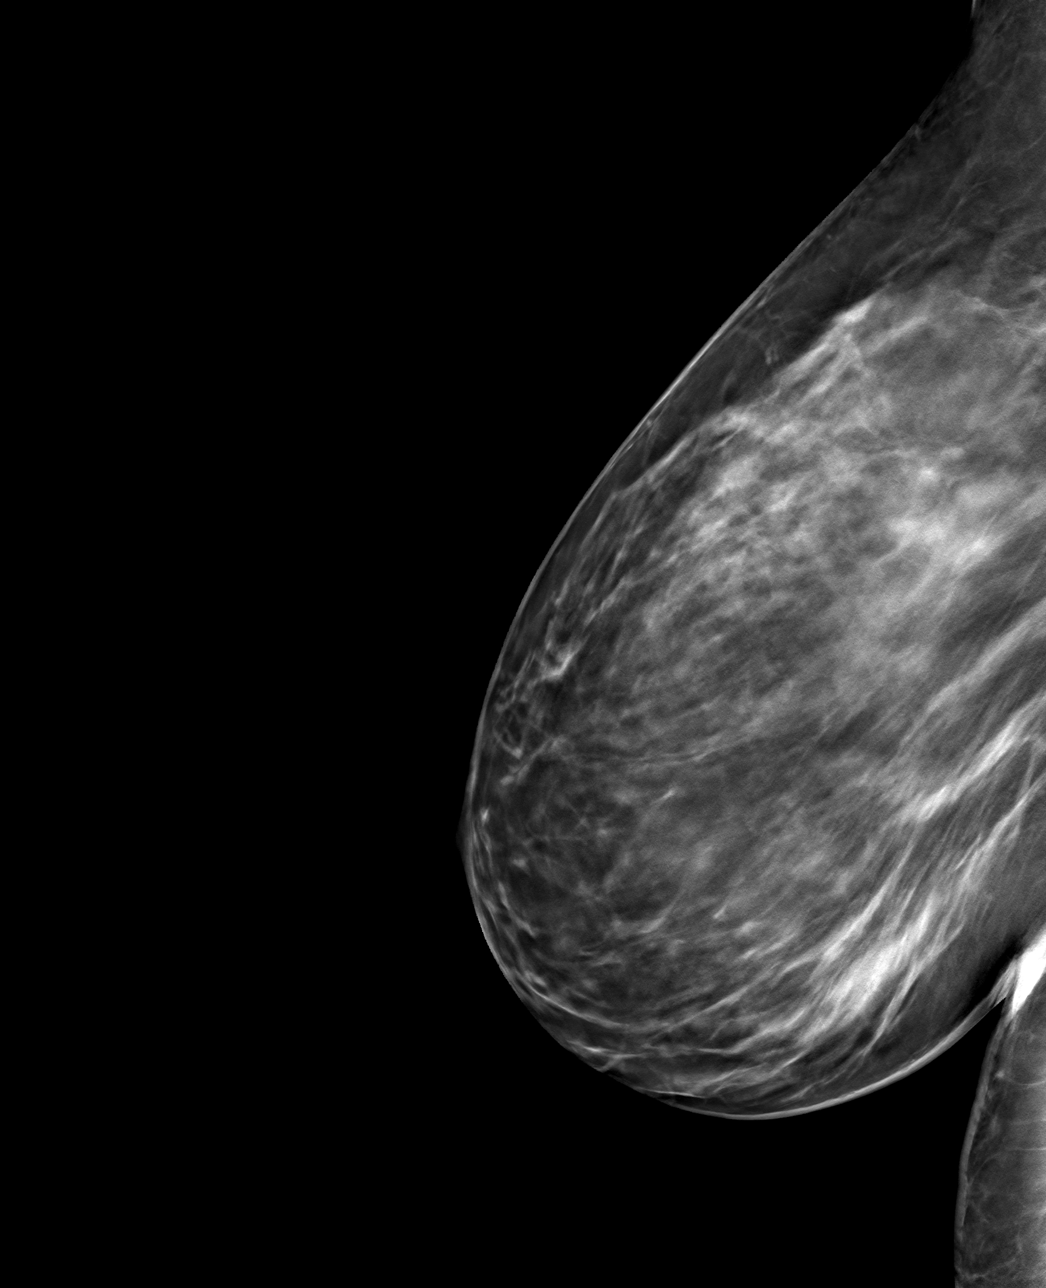

[4 of 12 positions shown; findings below may reference images not displayed]

FINDINGS: 3D Mammographic images were obtained following ultrasound guided
biopsy of the right breast. The biopsy marking clip is in expected
position at the site of biopsy.
IMPRESSION: Appropriate positioning of the ribbon shaped biopsy marking clip at
the site of biopsy in the central right breast.

Final Assessment: Post Procedure Mammograms for Marker Placement
